# Patient Record
Sex: Female | Born: 1945 | Race: White | Hispanic: No | Marital: Married | Smoking: Never smoker
Health system: Southern US, Community
[De-identification: ages and names within clinical notes are randomized; demographics above are authoritative.]

## PROBLEM LIST (undated history)

## (undated) DIAGNOSIS — M316 Other giant cell arteritis: Secondary | ICD-10-CM

## (undated) DIAGNOSIS — D351 Benign neoplasm of parathyroid gland: Secondary | ICD-10-CM

## (undated) DIAGNOSIS — R519 Headache, unspecified: Secondary | ICD-10-CM

## (undated) DIAGNOSIS — D649 Anemia, unspecified: Secondary | ICD-10-CM

## (undated) DIAGNOSIS — I679 Cerebrovascular disease, unspecified: Secondary | ICD-10-CM

## (undated) DIAGNOSIS — N611 Abscess of the breast and nipple: Secondary | ICD-10-CM

## (undated) DIAGNOSIS — E079 Disorder of thyroid, unspecified: Secondary | ICD-10-CM

## (undated) DIAGNOSIS — E785 Hyperlipidemia, unspecified: Secondary | ICD-10-CM

## (undated) DIAGNOSIS — N904 Leukoplakia of vulva: Secondary | ICD-10-CM

## (undated) DIAGNOSIS — I6529 Occlusion and stenosis of unspecified carotid artery: Secondary | ICD-10-CM

## (undated) DIAGNOSIS — M810 Age-related osteoporosis without current pathological fracture: Secondary | ICD-10-CM

## (undated) DIAGNOSIS — K317 Polyp of stomach and duodenum: Secondary | ICD-10-CM

## (undated) HISTORY — DX: Hyperlipidemia, unspecified: E78.5

## (undated) HISTORY — DX: Anemia, unspecified: D64.9

## (undated) HISTORY — DX: Other giant cell arteritis: M31.6

## (undated) HISTORY — DX: Age-related osteoporosis without current pathological fracture: M81.0

## (undated) HISTORY — DX: Benign neoplasm of parathyroid gland: D35.1

## (undated) HISTORY — DX: Occlusion and stenosis of unspecified carotid artery: I65.29

## (undated) HISTORY — DX: Abscess of the breast and nipple: N61.1

## (undated) HISTORY — PX: POLYPECTOMY: SHX149

## (undated) HISTORY — DX: Disorder of thyroid, unspecified: E07.9

## (undated) HISTORY — DX: Leukoplakia of vulva: N90.4

## (undated) HISTORY — DX: Cerebrovascular disease, unspecified: I67.9

## (undated) HISTORY — PX: COLONOSCOPY: SHX174

## (undated) HISTORY — DX: Polyp of stomach and duodenum: K31.7

---

## 1981-11-12 HISTORY — PX: TUBAL LIGATION: SHX77

## 1992-11-12 HISTORY — PX: KNEE SURGERY: SHX244

## 1998-05-18 ENCOUNTER — Other Ambulatory Visit: Admission: RE | Admit: 1998-05-18 | Discharge: 1998-05-18 | Payer: Self-pay | Admitting: Obstetrics and Gynecology

## 1999-05-17 ENCOUNTER — Other Ambulatory Visit: Admission: RE | Admit: 1999-05-17 | Discharge: 1999-05-17 | Payer: Self-pay | Admitting: Obstetrics and Gynecology

## 2000-04-03 ENCOUNTER — Encounter: Admission: RE | Admit: 2000-04-03 | Discharge: 2000-04-03 | Payer: Self-pay | Admitting: Obstetrics and Gynecology

## 2000-04-03 ENCOUNTER — Encounter: Payer: Self-pay | Admitting: Obstetrics and Gynecology

## 2000-06-19 ENCOUNTER — Other Ambulatory Visit: Admission: RE | Admit: 2000-06-19 | Discharge: 2000-06-19 | Payer: Self-pay | Admitting: Obstetrics and Gynecology

## 2001-04-08 ENCOUNTER — Encounter: Admission: RE | Admit: 2001-04-08 | Discharge: 2001-04-08 | Payer: Self-pay | Admitting: Obstetrics and Gynecology

## 2001-04-08 ENCOUNTER — Encounter: Payer: Self-pay | Admitting: Obstetrics and Gynecology

## 2001-07-01 ENCOUNTER — Other Ambulatory Visit: Admission: RE | Admit: 2001-07-01 | Discharge: 2001-07-01 | Payer: Self-pay | Admitting: Obstetrics and Gynecology

## 2002-01-08 ENCOUNTER — Encounter: Payer: Self-pay | Admitting: Emergency Medicine

## 2002-01-08 ENCOUNTER — Inpatient Hospital Stay (HOSPITAL_COMMUNITY): Admission: EM | Admit: 2002-01-08 | Discharge: 2002-01-09 | Payer: Self-pay | Admitting: Emergency Medicine

## 2002-01-08 ENCOUNTER — Encounter (INDEPENDENT_AMBULATORY_CARE_PROVIDER_SITE_OTHER): Payer: Self-pay | Admitting: Specialist

## 2002-01-08 HISTORY — PX: APPENDECTOMY: SHX54

## 2002-05-06 ENCOUNTER — Encounter: Payer: Self-pay | Admitting: Obstetrics and Gynecology

## 2002-05-06 ENCOUNTER — Encounter: Admission: RE | Admit: 2002-05-06 | Discharge: 2002-05-06 | Payer: Self-pay | Admitting: Obstetrics and Gynecology

## 2002-08-06 ENCOUNTER — Encounter: Admission: RE | Admit: 2002-08-06 | Discharge: 2002-08-06 | Payer: Self-pay | Admitting: Obstetrics and Gynecology

## 2002-08-06 ENCOUNTER — Encounter: Payer: Self-pay | Admitting: Obstetrics and Gynecology

## 2003-05-10 ENCOUNTER — Encounter: Payer: Self-pay | Admitting: Obstetrics and Gynecology

## 2003-05-10 ENCOUNTER — Encounter: Admission: RE | Admit: 2003-05-10 | Discharge: 2003-05-10 | Payer: Self-pay | Admitting: Obstetrics and Gynecology

## 2003-07-26 ENCOUNTER — Other Ambulatory Visit: Admission: RE | Admit: 2003-07-26 | Discharge: 2003-07-26 | Payer: Self-pay | Admitting: Obstetrics and Gynecology

## 2003-11-13 DIAGNOSIS — N611 Abscess of the breast and nipple: Secondary | ICD-10-CM

## 2003-11-13 HISTORY — DX: Abscess of the breast and nipple: N61.1

## 2004-03-01 ENCOUNTER — Encounter: Admission: RE | Admit: 2004-03-01 | Discharge: 2004-03-01 | Payer: Self-pay | Admitting: Obstetrics and Gynecology

## 2004-03-09 ENCOUNTER — Encounter: Admission: RE | Admit: 2004-03-09 | Discharge: 2004-03-09 | Payer: Self-pay | Admitting: Obstetrics and Gynecology

## 2004-05-10 ENCOUNTER — Encounter: Admission: RE | Admit: 2004-05-10 | Discharge: 2004-05-10 | Payer: Self-pay | Admitting: Obstetrics and Gynecology

## 2004-05-23 ENCOUNTER — Encounter: Admission: RE | Admit: 2004-05-23 | Discharge: 2004-05-23 | Payer: Self-pay | Admitting: Obstetrics and Gynecology

## 2004-08-08 ENCOUNTER — Other Ambulatory Visit: Admission: RE | Admit: 2004-08-08 | Discharge: 2004-08-08 | Payer: Self-pay | Admitting: Obstetrics and Gynecology

## 2005-05-11 ENCOUNTER — Encounter: Admission: RE | Admit: 2005-05-11 | Discharge: 2005-05-11 | Payer: Self-pay | Admitting: Internal Medicine

## 2005-08-27 ENCOUNTER — Other Ambulatory Visit: Admission: RE | Admit: 2005-08-27 | Discharge: 2005-08-27 | Payer: Self-pay | Admitting: Obstetrics and Gynecology

## 2006-05-13 ENCOUNTER — Encounter: Admission: RE | Admit: 2006-05-13 | Discharge: 2006-05-13 | Payer: Self-pay | Admitting: Obstetrics and Gynecology

## 2007-04-09 ENCOUNTER — Encounter: Admission: RE | Admit: 2007-04-09 | Discharge: 2007-04-09 | Payer: Self-pay | Admitting: Surgery

## 2007-05-15 ENCOUNTER — Encounter: Admission: RE | Admit: 2007-05-15 | Discharge: 2007-05-15 | Payer: Self-pay | Admitting: Internal Medicine

## 2007-06-13 HISTORY — PX: PARATHYROIDECTOMY: SHX19

## 2007-06-23 ENCOUNTER — Ambulatory Visit (HOSPITAL_COMMUNITY): Admission: RE | Admit: 2007-06-23 | Discharge: 2007-06-24 | Payer: Self-pay | Admitting: Surgery

## 2007-06-23 ENCOUNTER — Encounter (INDEPENDENT_AMBULATORY_CARE_PROVIDER_SITE_OTHER): Payer: Self-pay | Admitting: Surgery

## 2008-05-18 ENCOUNTER — Ambulatory Visit: Payer: Self-pay | Admitting: Vascular Surgery

## 2008-05-28 ENCOUNTER — Encounter: Admission: RE | Admit: 2008-05-28 | Discharge: 2008-05-28 | Payer: Self-pay | Admitting: Internal Medicine

## 2009-05-24 ENCOUNTER — Ambulatory Visit: Payer: Self-pay | Admitting: Vascular Surgery

## 2009-05-30 ENCOUNTER — Encounter: Admission: RE | Admit: 2009-05-30 | Discharge: 2009-05-30 | Payer: Self-pay | Admitting: Internal Medicine

## 2009-11-12 DIAGNOSIS — K317 Polyp of stomach and duodenum: Secondary | ICD-10-CM

## 2009-11-12 HISTORY — DX: Polyp of stomach and duodenum: K31.7

## 2009-12-29 ENCOUNTER — Ambulatory Visit: Payer: Self-pay | Admitting: Vascular Surgery

## 2010-03-23 ENCOUNTER — Encounter: Admission: RE | Admit: 2010-03-23 | Discharge: 2010-03-23 | Payer: Self-pay | Admitting: Internal Medicine

## 2010-06-01 ENCOUNTER — Encounter: Admission: RE | Admit: 2010-06-01 | Discharge: 2010-06-01 | Payer: Self-pay | Admitting: Internal Medicine

## 2010-07-05 ENCOUNTER — Ambulatory Visit: Payer: Self-pay | Admitting: Vascular Surgery

## 2010-07-26 ENCOUNTER — Observation Stay (HOSPITAL_COMMUNITY): Admission: EM | Admit: 2010-07-26 | Discharge: 2010-07-27 | Payer: Self-pay | Admitting: Emergency Medicine

## 2010-07-26 ENCOUNTER — Ambulatory Visit: Payer: Self-pay | Admitting: Internal Medicine

## 2010-07-27 ENCOUNTER — Encounter (INDEPENDENT_AMBULATORY_CARE_PROVIDER_SITE_OTHER): Payer: Self-pay | Admitting: Emergency Medicine

## 2010-08-10 ENCOUNTER — Ambulatory Visit (HOSPITAL_COMMUNITY): Admission: RE | Admit: 2010-08-10 | Discharge: 2010-08-10 | Payer: Self-pay | Admitting: Obstetrics and Gynecology

## 2011-01-10 ENCOUNTER — Other Ambulatory Visit (INDEPENDENT_AMBULATORY_CARE_PROVIDER_SITE_OTHER): Payer: BC Managed Care – PPO

## 2011-01-10 ENCOUNTER — Ambulatory Visit (INDEPENDENT_AMBULATORY_CARE_PROVIDER_SITE_OTHER): Payer: BC Managed Care – PPO | Admitting: Vascular Surgery

## 2011-01-10 DIAGNOSIS — I6529 Occlusion and stenosis of unspecified carotid artery: Secondary | ICD-10-CM

## 2011-01-16 NOTE — Procedures (Unsigned)
CAROTID DUPLEX EXAM  INDICATION:  Carotid disease.  HISTORY: Diabetes:  No. Cardiac:  No. Hypertension:  No. Smoking:  No. Previous Surgery:  No. CV History:  Currently asymptomatic. Amaurosis Fugax No, Paresthesias No, Hemiparesis No.                                      RIGHT             LEFT Brachial systolic pressure:         112               108 Brachial Doppler waveforms:         Normal            Normal Vertebral direction of flow:        Antegrade         Antegrade DUPLEX VELOCITIES (cm/sec) CCA peak systolic                   92                82 ECA peak systolic                   66                66 ICA peak systolic                   106               P = 55/M = 212 ICA end diastolic                   35                P = 25/M = 92 PLAQUE MORPHOLOGY: PLAQUE AMOUNT:                        None              None PLAQUE LOCATION:  IMPRESSION: 1. Increased Doppler velocities of the left mid internal carotid     artery suggest a 60% to 79% stenosis; however, no plaque was     adequately visualized throughout the carotid system. 2. No hemodynamically significant stenosis of the right internal     carotid artery. 3. No significant change in Doppler velocities noted when compared to     the previous examination on 07/05/2010.  ___________________________________________ Di Kindle. Edilia Bo, M.D.  CH/MEDQ  D:  01/11/2011  T:  01/11/2011  Job:  [id number]

## 2011-01-25 LAB — COMPREHENSIVE METABOLIC PANEL
ALT: 12 U/L (ref 0–35)
AST: 19 U/L (ref 0–37)
Albumin: 4.1 g/dL (ref 3.5–5.2)
Alkaline Phosphatase: 58 U/L (ref 39–117)
BUN: 16 mg/dL (ref 6–23)
CO2: 29 mEq/L (ref 19–32)
Calcium: 8.9 mg/dL (ref 8.4–10.5)
Chloride: 102 mEq/L (ref 96–112)
Creatinine, Ser: 0.55 mg/dL (ref 0.4–1.2)
GFR calc Af Amer: >60 mL/min (ref >60)
GFR calc non Af Amer: >60 mL/min (ref >60)
Glucose, Bld: 79 mg/dL (ref 70–99)
Potassium: 3.6 mEq/L (ref 3.5–5.1)
Sodium: 136 mEq/L (ref 135–145)
Total Bilirubin: 0.4 mg/dL (ref 0.3–1.2)
Total Protein: 6.7 g/dL (ref 6.0–8.3)

## 2011-01-25 LAB — CBC
HCT: 34.6 % — ABNORMAL LOW (ref 36.0–46.0)
HCT: 35.1 % — ABNORMAL LOW (ref 36.0–46.0)
Hemoglobin: 11.8 g/dL — ABNORMAL LOW (ref 12.0–15.0)
Hemoglobin: 11.4 g/dL — ABNORMAL LOW (ref 12.0–15.0)
MCH: 31.2 pg (ref 26.0–34.0)
MCH: 29.3 pg (ref 26.0–34.0)
MCHC: 34 g/dL (ref 30.0–36.0)
MCHC: 32.5 g/dL (ref 30.0–36.0)
MCV: 91.8 fL (ref 78.0–100.0)
MCV: 90.2 fL (ref 78.0–100.0)
Platelets: 174 10*3/uL (ref 150–400)
Platelets: 171 10*3/uL (ref 150–400)
RBC: 3.77 MIL/uL — ABNORMAL LOW (ref 3.87–5.11)
RBC: 3.89 MIL/uL (ref 3.87–5.11)
RDW: 13.5 % (ref 11.5–15.5)
RDW: 13.1 % (ref 11.5–15.5)
WBC: 5.4 10*3/uL (ref 4.0–10.5)
WBC: 7.1 10*3/uL (ref 4.0–10.5)

## 2011-01-25 LAB — DIFFERENTIAL
Basophils Absolute: 0 10*3/uL (ref 0.0–0.1)
Basophils Relative: 0 % (ref 0–1)
Eosinophils Absolute: 0 10*3/uL (ref 0.0–0.7)
Eosinophils Relative: 0 % (ref 0–5)
Lymphocytes Relative: 18 % (ref 12–46)
Lymphs Abs: 1.3 10*3/uL (ref 0.7–4.0)
Monocytes Absolute: 0.5 10*3/uL (ref 0.1–1.0)
Monocytes Relative: 6 % (ref 3–12)
Neutro Abs: 5.4 10*3/uL (ref 1.7–7.7)
Neutrophils Relative %: 76 % (ref 43–77)

## 2011-01-25 LAB — POCT CARDIAC MARKERS
CKMB, poc: 1 ng/mL (ref 1.0–8.0)
CKMB, poc: 1.5 ng/mL (ref 1.0–8.0)
CKMB, poc: 3.7 ng/mL (ref 1.0–8.0)
Myoglobin, poc: 283 ng/mL (ref 12–200)
Myoglobin, poc: 64.7 ng/mL (ref 12–200)
Myoglobin, poc: 68.4 ng/mL (ref 12–200)
Troponin i, poc: <0.05 ng/mL (ref 0.00–0.09)
Troponin i, poc: <0.05 ng/mL (ref 0.00–0.09)
Troponin i, poc: <0.05 ng/mL (ref 0.00–0.09)

## 2011-01-25 LAB — POCT I-STAT, CHEM 8
BUN: 14 mg/dL (ref 6–23)
Calcium, Ion: 1.19 mmol/L (ref 1.12–1.32)
Chloride: 102 mEq/L (ref 96–112)
Creatinine, Ser: 0.7 mg/dL (ref 0.4–1.2)
Glucose, Bld: 75 mg/dL (ref 70–99)
HCT: 37 % (ref 36.0–46.0)
Hemoglobin: 12.6 g/dL (ref 12.0–15.0)
Potassium: 3.6 mEq/L (ref 3.5–5.1)
Sodium: 142 mEq/L (ref 135–145)
TCO2: 28 mmol/L (ref 0–100)

## 2011-03-27 NOTE — Assessment & Plan Note (Signed)
OFFICE VISIT   MAHROSH, DONNELL  DOB:  [birth year]                                       05/24/2009  JWJXB#:[id number]   I saw the patient in the office today for followup of her carotid  disease.  I had last seen her in January 2008 when she was asymptomatic.  At that time she had no carotid stenosis on the right and a distal left  carotid stenosis in the 40-59% range.  We have been following this  moderate distal left carotid stenosis, and her most recent duplex was on  05/18/2008, when she had had some slightly increased velocities in her  left carotid stenosis but it was still in the 40-59% range.  She came in  today for a routine followup duplex, and the stenosis on the left had  progressed slightly and now was in the 60-79% range on the left with no  significant stenosis on the right.  Of note, she is left-handed.  She  denies any history of stroke, TIAs, expressive or receptive aphasia, or  amaurosis fugax.   PAST MEDICAL HISTORY:  Fairly unremarkable.  She denies any history of  diabetes, hypertension previous myocardial infarction or congestive  heart failure.  She does have slightly elevated cholesterol.   MEDICATIONS:  Include Fosamax, simvastatin, Caltrate and vitamin D.   REVIEW OF SYSTEMS:  She has had no recent chest pain, chest pressure,  palpitations or arrhythmias.  She has had no productive cough,  bronchitis, asthma or wheezing.   PHYSICAL EXAMINATION:  This is a pleasant 65 year old who appears her  stated age.  Her blood pressure is 128/81, heart rate is 63.  There is  soft left carotid bruit.  Lungs are clear bilaterally to auscultation.  On cardiac exam, she has a regular rate and rhythm.  Neurologic exam is  nonfocal.   Carotid duplex scan shows no significant carotid stenosis on the right.  On the left side she has a 60-79% stenosis.  End-diastolic velocity on  the left is 73 cm/sec.  She has patent vertebral arteries with  normally-  directed flow.   I have explained we would not consider left carotid endarterectomy  unless the stenosis progressed to greater than 80% or she developed new  neurologic symptoms.  I plan on seeing her back in 6 months with a  followup carotid duplex scan.  She knows to call sooner if she has  problems.  In the meantime she knows to continue taking her aspirin.  Of  note, we will need to determine if the stenosis does progress to greater  than 80% if she might need a preoperative cerebral arteriogram if the  lesion appears to be quite high based on her duplex.   Di Kindle. Edilia Bo, M.D.  Electronically Signed   CSD/MEDQ  D:  05/24/2009  T:  05/25/2009  Job:  [id number]   cc:   Barry Dienes. Eloise Harman, M.D.

## 2011-03-27 NOTE — Procedures (Signed)
CAROTID DUPLEX EXAM   INDICATION:  Follow-up evaluation of known carotid artery disease.   HISTORY:  Diabetes:  No.  Cardiac:  No.  Hypertension:  No.  Smoking:  No.  Previous Surgery:  No.  CV History:  No.  Amaurosis Fugax No, Paresthesias No, Hemiparesis No.                                       RIGHT             LEFT  Brachial systolic pressure:         116               122  Brachial Doppler waveforms:         Biphasic          Biphasic  Vertebral direction of flow:        Antegrade         Antegrade  DUPLEX VELOCITIES (cm/sec)  CCA peak systolic                   104               104  ECA peak systolic                   89                98  ICA peak systolic                   94                212 (distal)  ICA end diastolic                   41                73  PLAQUE MORPHOLOGY:                  Homogenous        Homogenous  PLAQUE AMOUNT:                      Mild              Moderate  PLAQUE LOCATION:                    BIF, ICA          ICA distal   IMPRESSION:  1. 20-39% right internal carotid artery stenosis.  2. Left distal internal carotid artery velocities suggest 60-79%      stenosis.        ___________________________________________  Di Kindle. Edilia Bo, M.D.   AC/MEDQ  D:  05/24/2009  T:  05/24/2009  Job:  [id number]

## 2011-03-27 NOTE — Op Note (Signed)
Tiffany Nguyen, Tiffany Nguyen NO.:  [id number]   MEDICAL RECORD NO.:  [id number]          PATIENT TYPE:  AMB   LOCATION:  DAY                          FACILITY:  [facility]   PHYSICIAN:  Velora Heckler, MD      DATE OF BIRTH:  1946-04-16   DATE OF PROCEDURE:  06/23/2007  DATE OF DISCHARGE:                               OPERATIVE REPORT   PREOPERATIVE DIAGNOSIS:  Primary hyperparathyroidism.   POSTOPERATIVE DIAGNOSIS:  Primary hyperparathyroidism.   PROCEDURE:  Right inferior minimally-invasive parathyroidectomy.   SURGEON:  Velora Heckler, MD, FACS   ASSISTANT:  Cyndia Bent, MD, FACS   ANESTHESIA:  General per Jenelle Mages. Fortune, MD   ESTIMATED BLOOD LOSS:  Minimal.   PREPARATION:  Betadine.   COMPLICATIONS:  None.   INDICATIONS:  The patient is a 65 year old white female from [place],  [place].  She was noted to have an elevated serum calcium level.  Laboratory studies included an elevated PTH level.  Bone density scan  showed osteoporosis.  The patient notes fatigue.  She is referred by Dr.  Jarome Matin for primary hyperparathyroidism.  Sestamibi scan was  performed, which demonstrated uptake in the right inferior position.  The patient now comes to surgery for resection.   BODY OF REPORT:  The procedure was done in OR #1 at the [facility].  The patient is brought to the operating room,  placed in a supine position on the operating room table.  Following  administration of general anesthesia the patient is positioned and then  prepped and draped in the usual strict aseptic fashion.  After  ascertaining that an adequate level of anesthesia had been obtained, a  right inferior neck incision is made with a #15 blade.  Dissection is  carried through subcutaneous tissues and platysma.  Hemostasis is  obtained with the electrocautery.  An external jugular vein is ligated  in continuity and divided.  Strap muscles are incised in  the midline and  reflected laterally.  Inferior pole of the right lobe of the thyroid is  exposed.  Dissection in the tracheoesophageal groove on the right  demonstrates an abnormally enlarged parathyroid gland.  This is gently  mobilized with blunt dissection.  The vascular tributaries are divided  with the electrocautery.  The gland is mobilized up onto its vascular  pedicle.  The gland is markedly elongated and enlarged.  Vascular  pedicle is dissected out and divided between medium ligaclips.  It is  excised in its entirety.  It measures 3.5 cm in greatest dimension.  The  gland is submitted to pathology, where Dr. Jimmy Picket performed frozen  section biopsy confirming parathyroid tissue consistent with adenoma.  Good hemostasis is obtained in the neck.  A small piece of Surgicel is  placed in the surgical field.  Strap muscles are reapproximated in the  midline with interrupted 3-0 Vicryl sutures.  Platysma is closed with  interrupted 3-0 Vicryl sutures.  Skin is anesthetized with local  Marcaine.  Skin edges are  reapproximated running with a 4-0 Monocryl subcuticular suture.  Wound  is washed and dried and Benzoin and Steri-Strips are applied.  Sterile  dressings are applied.  The patient is awakened from anesthesia and  brought to the recovery room in stable condition.  The patient tolerated  the procedure well.      Velora Heckler, MD  Electronically Signed     TMG/MEDQ  D:  06/23/2007  T:  06/24/2007  Job:  [id number]   cc:   Barry Dienes. Eloise Harman, M.D.  Fax: [phone]

## 2011-03-27 NOTE — Assessment & Plan Note (Signed)
OFFICE VISIT   Tiffany Nguyen, Tiffany Nguyen  DOB:  12-Mar-1946                                       07/05/2010  NFAOZ#:[id number]   I saw the patient in the office today for continued followup of her  right carotid disease.  We have been following a 60%-79% left carotid  stenosis with no significant stenosis on the right.  She comes in for a  routine followup visit.  Since I saw her last in July of 2010 she denies  any history of stroke, TIAs, expressive or receptive aphasia or  amaurosis fugax.  There has been no significant change in her medical  history.  She denies any history of diabetes, hypertension,  hypercholesterolemia or history of coronary artery disease.   On social history she is married.  She has one child.  She does not use  tobacco.   REVIEW OF SYSTEMS:  CARDIOVASCULAR:  She has had no chest pain, chest  pressure, palpitations or arrhythmias.  She has had no claudication,  rest pain or nonhealing ulcers.  She has had no history of DVT or  phlebitis.  PULMONARY:  She has had no productive cough, bronchitis, asthma or  wheezing.   PHYSICAL EXAMINATION:  General:  This is a pleasant 65 year old woman  who appears her stated age.  Vital signs:  Her blood pressure is 105/68,  heart rate is 65, temperature is 97.4.  HEENT:  Unremarkable.  Lungs:  Are clear bilaterally to auscultation.  Cardiovascular:  I do not detect  any carotid bruits.  She has a regular rate and rhythm.  Abdomen:  Soft  and nontender.  Neurological:  She has no focal weakness or  paresthesias.   I did independently interpret her carotid duplex scan today which shows  no significant change in the velocities on the left.  She has a 60%-79%  left carotid stenosis with no significant stenosis on the right.  Both  vertebral arteries are patent with normally directed flow.   I again explained that we would not consider left carotid endarterectomy  unless the stenosis progressed to  greater than 80% or if she developed  new left hemispheric symptoms.  Of note, the stenosis is fairly high.  If this does progress we might have to consider cerebral arteriography  preoperatively.  The lab will do her followup duplex scan in 6 months  and I will plan on seeing her back only if she develops new symptoms or  her stenosis progresses.     Di Kindle. Edilia Bo, M.D.  Electronically Signed   CSD/MEDQ  D:  07/05/2010  T:  07/06/2010  Job:  [id number]   cc:   Barry Dienes. Eloise Harman, M.D.

## 2011-03-27 NOTE — Procedures (Signed)
CAROTID DUPLEX EXAM   INDICATION:  Followup carotid artery disease.   HISTORY:  Diabetes:  No.  Cardiac:  No.  Hypertension:  No.  Smoking:  No.  Previous Surgery:  No.  CV History:  Asymptomatic.  Amaurosis Fugax No, Paresthesias No, Hemiparesis No                                       RIGHT             LEFT  Brachial systolic pressure:         106               108  Brachial Doppler waveforms:         WNL               WNL  Vertebral direction of flow:        Antegrade         Antegrade  DUPLEX VELOCITIES (cm/sec)  CCA peak systolic                   97                96  ECA peak systolic                   79                79  ICA peak systolic                   100               M/D=246  ICA end diastolic                   45                M/D=108  PLAQUE MORPHOLOGY:                                    None visualized  PLAQUE AMOUNT:                      None              None visualized  PLAQUE LOCATION:                                      None visualized   IMPRESSION:  1. Right internal carotid artery appears within normal limits.  2. Left internal carotid artery distal velocities appear higher than      previous, however, still in the 60%-79% stenosis range.  3. Left internal carotid artery area of elevated velocities extends      from approximately 2.65 cm from the bifurcation to approximately 4      cm from the bifurcation, distal to which velocities drop back to      within normal limits.   ___________________________________________  Di Kindle. Edilia Bo, M.D.   AS/MEDQ  D:  12/29/2009  T:  12/29/2009  Job:  [id number]

## 2011-03-27 NOTE — Procedures (Signed)
CAROTID DUPLEX EXAM   INDICATION:  Carotid disease.   HISTORY:  Diabetes:  No  Cardiac:  No.  Hypertension:  No.  Smoking:  No.  Previous Surgery:  No.  CV History:  Currently asymptomatic.  Amaurosis Fugax No, Paresthesias No, Hemiparesis No.                                       RIGHT             LEFT  Brachial systolic pressure:         110               116  Brachial Doppler waveforms:         Normal            Normal  Vertebral direction of flow:        Antegrade         Antegrade  DUPLEX VELOCITIES (cm/sec)  CCA peak systolic                   105               83  ECA peak systolic                   88                81  ICA peak systolic                   73                229 (mid)  ICA end diastolic                   30                99  PLAQUE MORPHOLOGY:  PLAQUE AMOUNT:                      None              None  PLAQUE LOCATION:   IMPRESSION:  1. No evidence of stenosis noted in the right internal carotid artery.  2. Increased Doppler velocities of the left mid internal carotid      artery suggest a 60% to 79% stenosis; however, no plaque formation      was adequately visualized in this region.  3. No significant change noted when compared to the previous      examination on 12/29/2009.   ___________________________________________  Di Kindle. Edilia Bo, M.D.   CH/MEDQ  D:  07/05/2010  T:  07/05/2010  Job:  [id number]

## 2011-03-27 NOTE — Procedures (Signed)
CAROTID DUPLEX EXAM   INDICATION:  Followup of known increased velocity in left distal ICA.   HISTORY:  Diabetes:  No.  Cardiac:  No.  Hypertension:  No.  Smoking:  No.  Previous Surgery:  No.  CV History:  Amaurosis Fugax No, Paresthesias No, Hemiparesis No.                                       RIGHT             LEFT  Brachial systolic pressure:         113               115  Brachial Doppler waveforms:         WNL               WNL  Vertebral direction of flow:        Antegrade         Antegrade  DUPLEX VELOCITIES (cm/sec)  CCA peak systolic                   103               81  ECA peak systolic                   76                59  ICA peak systolic                   83                185 (Distal)  ICA end diastolic                   31                72 (Distal)  PLAQUE MORPHOLOGY:                  None              None  PLAQUE AMOUNT:                      None              None  PLAQUE LOCATION:                    None              Not visualized   IMPRESSION:  1. No proximal internal carotid artery stenosis bilaterally.  2. Increased velocities noted in left distal internal carotid artery,      which has increased since previous study; however, is categorically      unchanged.      ___________________________________________  Di Kindle. Edilia Bo, M.D.   PB/MEDQ  D:  05/18/2008  T:  05/18/2008  Job:  [id number]

## 2011-03-30 NOTE — H&P (Signed)
. [facility]  Patient:    Tiffany Nguyen, Tiffany Nguyen Visit Number: [id number] MRN: [id number]          Service Type: MED Location: 1800 [id number] [id number] Attending Physician:  Devoria Albe Dictated by:   Angelia Mould. Derrell Lolling, M.D. Admit Date:  01/08/2002   CC:         Dr. Tressia Miners. Ambrose Mantle, M.D.   History and Physical  CHIEF COMPLAINT:  Abdominal pain and vomiting.  HISTORY OF PRESENT ILLNESS:  This is a 65 year old white female in excellent health.  Yesterday at about 2 p.m. she noticed somewhat insidious onset of mid and lower abdominal pain, a little bit crampy. This persisted.  She has not had any diarrhea. She has had two normal bowel movements today. She has become nauseated and has vomited once today. The pain is now localized to the right lower quadrant.  She had a CT scan at the direction of the emergency department physician which shows acute appendicitis but no evidence of abscess or rupture.  PAST MEDICAL HISTORY:  She has had left knee arthroscopy.  She has had a laparoscopic tubal.  CURRENT MEDICATIONS:  Caltrate.  She recently took estrogens but that has been discontinued.  DRUG ALLERGIES:  None named.  SOCIAL HISTORY:  She is married.  She has one daughter. 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 She denies any use of alcohol or tobacco.  FAMILY HISTORY:  Mother died at age 5.  She fell then had a pulmonary embolus after orthopedic surgery.  Father died at age 73, had COPD, arthritis, and died of "natural causes."  She has two brothers, one had appendicitis recently.  REVIEW OF SYSTEMS:  All systems were reviewed and are noncontributory except as described above.  PHYSICAL EXAMINATION:  GENERAL APPEARANCE:  A thin, health, pleasant white female in mild distress.  VITAL SIGNS:  Temperature 97.9, heart rate 74, blood pressure 104/56, respiratory rate 20.  HEENT:  Sclerae are clear. Extraocular  movements intact.  Oropharynx clear.  NECK:  Supple, nontender, no mass, no bruit.  No thyromegaly.  LUNGS:  Clear to auscultation, no CVA tenderness.  CARDIOVASCULAR:  Regular rate and rhythm with no murmur.  BREASTS:  Not examined.  ABDOMEN:  Not distended, soft, hypoactive bowel sounds.  She does have localized tenderness and localized guarding in the right lower quadrant. I do not feel a mass. She does not have any referred rebound and minimal direct rebound.  EXTREMITIES:  No edema and good pulses.  NEUROLOGICAL:  Grossly within normal limits.  ADMISSION DATA:  White blood cell count 12,900. Complete metabolic panel normal.  Amylase 80.  Urinalysis showed 7 to 10 wbc.  CT scan is consistent with acute appendicitis.  IMPRESSION:  Acute appendicitis, possible early, without evidence of rupture.  PLAN:  The patient will be taken to the operating room for a diagnostic laparoscopy and appendectomy.  I have explained to the patient and her husband indications and details of this procedure.  Risks and complications have been outlined, including but not limited to bleeding, infection, conversion of laparotomy, injury to adjacent organs, wound problems such as hernia or infection.  She has been advised that this may be some other diagnosis including cancer, colitis, Crohns disease, gynecologic problems and so forth, but appendicitis is by far the most common problem.  She seems to understand all of these issues well and at this time all of her questions  are answered. She agrees with this plan and would like to go ahead with surgery now. Dictated by:   Angelia Mould. Derrell Lolling, M.D. Attending Physician:  Devoria Albe DD:  01/08/02 TD:  01/08/02 Job: [id number]

## 2011-04-27 ENCOUNTER — Other Ambulatory Visit (HOSPITAL_BASED_OUTPATIENT_CLINIC_OR_DEPARTMENT_OTHER): Payer: Self-pay | Admitting: Internal Medicine

## 2011-04-27 DIAGNOSIS — Z1231 Encounter for screening mammogram for malignant neoplasm of breast: Secondary | ICD-10-CM

## 2011-05-18 ENCOUNTER — Other Ambulatory Visit (HOSPITAL_BASED_OUTPATIENT_CLINIC_OR_DEPARTMENT_OTHER): Payer: Self-pay | Admitting: Internal Medicine

## 2011-05-18 DIAGNOSIS — M545 Low back pain, unspecified: Secondary | ICD-10-CM

## 2011-05-22 ENCOUNTER — Ambulatory Visit
Admission: RE | Admit: 2011-05-22 | Discharge: 2011-05-22 | Disposition: A | Discharge status: Home / Self Care | Payer: BC Managed Care – PPO | Source: Ambulatory Visit | Attending: Internal Medicine | Admitting: Internal Medicine

## 2011-05-22 DIAGNOSIS — M545 Low back pain, unspecified: Secondary | ICD-10-CM

## 2011-06-04 ENCOUNTER — Ambulatory Visit
Admission: RE | Admit: 2011-06-04 | Discharge: 2011-06-04 | Disposition: A | Discharge status: Home / Self Care | Payer: BC Managed Care – PPO | Source: Ambulatory Visit | Attending: Internal Medicine | Admitting: Internal Medicine

## 2011-06-04 DIAGNOSIS — Z1231 Encounter for screening mammogram for malignant neoplasm of breast: Secondary | ICD-10-CM

## 2011-06-27 ENCOUNTER — Other Ambulatory Visit: Payer: BC Managed Care – PPO

## 2011-07-04 ENCOUNTER — Other Ambulatory Visit (INDEPENDENT_AMBULATORY_CARE_PROVIDER_SITE_OTHER): Payer: BC Managed Care – PPO

## 2011-07-04 DIAGNOSIS — I6529 Occlusion and stenosis of unspecified carotid artery: Secondary | ICD-10-CM

## 2011-07-12 NOTE — Procedures (Unsigned)
CAROTID DUPLEX EXAM  INDICATION:  Follow up carotid stenosis.  HISTORY: Diabetes:  No. Cardiac:  No. Hypertension:  No. Smoking:  No. Previous Surgery:  No previous carotid intervention. CV History:  Asymptomatic. Amaurosis Fugax No, Paresthesias No, Hemiparesis No.                                      RIGHT             LEFT Brachial systolic pressure:         100               108 Brachial Doppler waveforms:         WNL               WNL Vertebral direction of flow:        Antegrade         Antegrade DUPLEX VELOCITIES (cm/sec) CCA peak systolic                   106               97 ECA peak systolic                   96                84 ICA peak systolic                   86                97 - P/189 - M ICA end diastolic                   31                34 - P/70 - M PLAQUE MORPHOLOGY:                  Heterogenous      Heterogenous PLAQUE AMOUNT:                      Mild              Moderate PLAQUE LOCATION:                    CCA, ICA          ICA, mid  IMPRESSION: 1. Right internal carotid artery stenosis in the 1% to 39% range. 2. Left internal carotid artery stenosis in the 60% to 79% range in     the mid segment; however, no significant plaque could be adequately     identified.  Vessel tortuosity is present throughout the left     internal carotid artery system. 3. Essentially unchanged since the previous study on 01/10/2011.  ___________________________________________ Di Kindle. Edilia Bo, M.D.  SH/MEDQ  D:  07/04/2011  T:  07/04/2011  Job:  [id number]

## 2011-08-27 LAB — BASIC METABOLIC PANEL
BUN: 9
CO2: 29
Calcium: 11.1 — ABNORMAL HIGH
Chloride: 108
Creatinine, Ser: 0.57
GFR calc Af Amer: >60
GFR calc non Af Amer: >60
Glucose, Bld: 98
Potassium: 3.9
Sodium: 139

## 2011-08-27 LAB — DIFFERENTIAL
Basophils Absolute: 0
Basophils Relative: 1
Eosinophils Absolute: 0
Eosinophils Relative: 1
Lymphocytes Relative: 32
Lymphs Abs: 1.7
Monocytes Absolute: 0.3
Monocytes Relative: 6
Neutro Abs: 3.2
Neutrophils Relative %: 61

## 2011-08-27 LAB — URINALYSIS, ROUTINE W REFLEX MICROSCOPIC
Bilirubin Urine: NEGATIVE
Glucose, UA: NEGATIVE
Hgb urine dipstick: NEGATIVE
Ketones, ur: NEGATIVE
Nitrite: NEGATIVE
Protein, ur: NEGATIVE
Specific Gravity, Urine: 1.007
Urobilinogen, UA: 0.2
pH: 6

## 2011-08-27 LAB — CBC
HCT: 32.8 — ABNORMAL LOW
Hemoglobin: 11.3 — ABNORMAL LOW
MCHC: 34.3
MCV: 88.3
Platelets: 188
RBC: 3.72 — ABNORMAL LOW
RDW: 13.6
WBC: 5.3

## 2011-08-27 LAB — CALCIUM
Calcium: 8.8
Calcium: 9.7

## 2011-08-27 LAB — PROTIME-INR
INR: 1.1
Prothrombin Time: 14

## 2012-01-24 ENCOUNTER — Other Ambulatory Visit: Payer: Self-pay | Admitting: *Deleted

## 2012-01-24 ENCOUNTER — Encounter: Payer: Self-pay | Admitting: Vascular Surgery

## 2012-01-25 DIAGNOSIS — D649 Anemia, unspecified: Secondary | ICD-10-CM

## 2012-01-25 HISTORY — DX: Anemia, unspecified: D64.9

## 2012-01-29 ENCOUNTER — Encounter: Payer: Self-pay | Admitting: Vascular Surgery

## 2012-01-30 ENCOUNTER — Ambulatory Visit (INDEPENDENT_AMBULATORY_CARE_PROVIDER_SITE_OTHER): Payer: BC Managed Care – PPO | Admitting: Vascular Surgery

## 2012-01-30 ENCOUNTER — Ambulatory Visit (INDEPENDENT_AMBULATORY_CARE_PROVIDER_SITE_OTHER): Payer: BC Managed Care – PPO | Admitting: *Deleted

## 2012-01-30 ENCOUNTER — Encounter: Payer: Self-pay | Admitting: Vascular Surgery

## 2012-01-30 VITALS — BP 110/70 | HR 71 | Resp 16 | Ht 65.0 in | Wt 108.0 lb

## 2012-01-30 DIAGNOSIS — I6529 Occlusion and stenosis of unspecified carotid artery: Secondary | ICD-10-CM

## 2012-01-30 NOTE — Progress Notes (Signed)
[facility] [place]  Patient name: Tiffany Nguyen MRN: [id number] DOB: 1946/03/21 Sex: female  REASON FOR VISIT: Follow up of carotid disease  HPI: Tiffany Nguyen is a 66 y.o. female who I been following with a moderate left carotid stenosis. She been seen at yearly intervals. Since I saw her last, she denies any history of stroke, TIAs, expressive or receptive aphasia, or amaurosis fugax.  There has been no significant change in her medical history. She has a history of hyperlipidemia which is been well controlled on her current medication.  Past Medical History  Diagnosis Date  . Hyperlipidemia   . Anemia 01/25/12    Slight    Family History  Problem Relation Age of Onset  . COPD Father   . Hypertension Mother     SOCIAL HISTORY: History  Substance Use Topics  . Smoking status: Never Smoker   . Smokeless tobacco: Not on file  . Alcohol Use: No    Allergies  Allergen Reactions  . Reprexain Nausea And Vomiting    Current Outpatient Prescriptions  Medication Sig Dispense Refill  . alendronate (FOSAMAX) 40 MG tablet Take 40 mg by mouth every 7 (seven) days. Take with a full glass of water on an empty stomach.      Marland Kitchen aspirin 81 MG tablet Take 81 mg by mouth daily.      . Calcium Carbonate-Vitamin D (CALTRATE 600+D PO) Take by mouth.      . simvastatin (ZOCOR) 40 MG tablet Take 40 mg by mouth every evening.      . Vitamin D, Ergocalciferol, (DRISDOL) 50000 UNITS CAPS Take 50,000 Units by mouth.        REVIEW OF SYSTEMS: Arly.Keller ] denotes positive finding; [  ] denotes negative finding  CARDIOVASCULAR:  [ ]  chest pain   [ ]  chest pressure   [ ]  palpitations   [ ]  orthopnea   [ ]  dyspnea on exertion   [ ]  claudication   [ ]  rest pain   [ ]  DVT   [ ]  phlebitis PULMONARY:   [ ]  productive cough   [ ]  asthma   [ ]  wheezing NEUROLOGIC:   [ ]  weakness  [ ]  paresthesias  [ ]  aphasia  [ ]  amaurosis  [ ]  dizziness HEMATOLOGIC:   [ ]  bleeding problems   [ ]  clotting  disorders MUSCULOSKELETAL:  [ ]  joint pain   [ ]  joint swelling [ ]  leg swelling GASTROINTESTINAL: [ ]   blood in stool  [ ]   hematemesis GENITOURINARY:  [ ]   dysuria  [ ]   hematuria PSYCHIATRIC:  [ ]  history of major depression INTEGUMENTARY:  [ ]  rashes  [ ]  ulcers CONSTITUTIONAL:  [ ]  fever   [ ]  chills  PHYSICAL EXAM: Filed Vitals:   01/30/12 1619 01/30/12 1624  BP: 106/48 110/70  Pulse: 67 71  Resp: 16   Height: 5\' 5"  (1.651 m)   Weight: 108 lb (48.988 kg)   SpO2: 99% 98%   Body mass index is 17.97 kg/(m^2). GENERAL: The patient is a well-nourished female, in no acute distress. The vital signs are documented above. CARDIOVASCULAR: There is a regular rate and rhythm without significant murmur appreciated. I do not detect carotid bruits. She has palpable popliteal and pedal pulses. She has no significant lower extremity swelling. PULMONARY: There is good air exchange bilaterally without wheezing or rales. ABDOMEN: Soft and non-tender with normal pitched bowel sounds.  MUSCULOSKELETAL: There are no major  deformities or cyanosis. NEUROLOGIC: No focal weakness or paresthesias are detected. SKIN: There are no ulcers or rashes noted. PSYCHIATRIC: The patient has a normal affect.  DATA:  I have independently interpreted her carotid duplex scan which shows no significant carotid stenosis on the right. On the left side is a 60-79% stenosis. The velocities had not changed compared this study one year ago.  MEDICAL ISSUES: She understands we would not consider left carotid endarterectomy unless the stenosis progressed to greater than 80% or he developed left hemispheric symptoms. I've ordered a fall carotid duplex scan in 1 year and I'll see her back at that time. She knows to call sooner if she has problems. In the meantime she knows to continue taking her aspirin.  Chipper Koudelka S [facility]  Beeper: [phone]

## 2012-02-06 NOTE — Procedures (Unsigned)
CAROTID DUPLEX EXAM  INDICATION:  Follow up carotid artery stenosis.  HISTORY: Diabetes:  No. Cardiac:  No. Hypertension:  No. Smoking:  No. Previous Surgery: CV History:  Asymptomatic. Amaurosis Fugax No, Paresthesias No, Hemiparesis No.                                      RIGHT             LEFT Brachial systolic pressure:         102               102 Brachial Doppler waveforms:         Triphasic         Triphasic Vertebral direction of flow:        Antegrade         Antegrade DUPLEX VELOCITIES (cm/sec) CCA peak systolic                   115               105 ECA peak systolic                   99                79 ICA peak systolic                   98 (midcurve ICA )                  235 (mid tortuous) ICA end diastolic                   50                85 PLAQUE MORPHOLOGY:                  Minimal           Minimal PLAQUE AMOUNT:                      Soft              Soft PLAQUE LOCATION:                    ICA               ICA  IMPRESSION: 1. 1% to 39% right internal carotid artery stenosis. 2. Velocities in the 60% to 79% range in the left mid internal carotid     artery segment.  Vessel is tortuous, as noted on previous study. 3. No change since prior study of 07/04/11.  ___________________________________________ Di Kindle. Edilia Bo, M.D.  SS/MEDQ  D:  01/30/2012  T:  01/30/2012  Job:  [id number]

## 2012-04-30 ENCOUNTER — Other Ambulatory Visit: Payer: Self-pay | Admitting: Internal Medicine

## 2012-04-30 DIAGNOSIS — Z1231 Encounter for screening mammogram for malignant neoplasm of breast: Secondary | ICD-10-CM

## 2012-06-04 ENCOUNTER — Ambulatory Visit
Admission: RE | Admit: 2012-06-04 | Discharge: 2012-06-04 | Disposition: A | Discharge status: Home / Self Care | Payer: BC Managed Care – PPO | Source: Ambulatory Visit | Attending: Internal Medicine | Admitting: Internal Medicine

## 2012-06-04 DIAGNOSIS — Z1231 Encounter for screening mammogram for malignant neoplasm of breast: Secondary | ICD-10-CM

## 2012-07-15 IMAGING — CR DG CHEST 1V PORT
1 series · 1 of 1 positions shown · non-contrast
Comparison: 06/18/2007

CLINICAL DATA: Chest pain

PORTABLE CHEST - 1 VIEW

[AP]
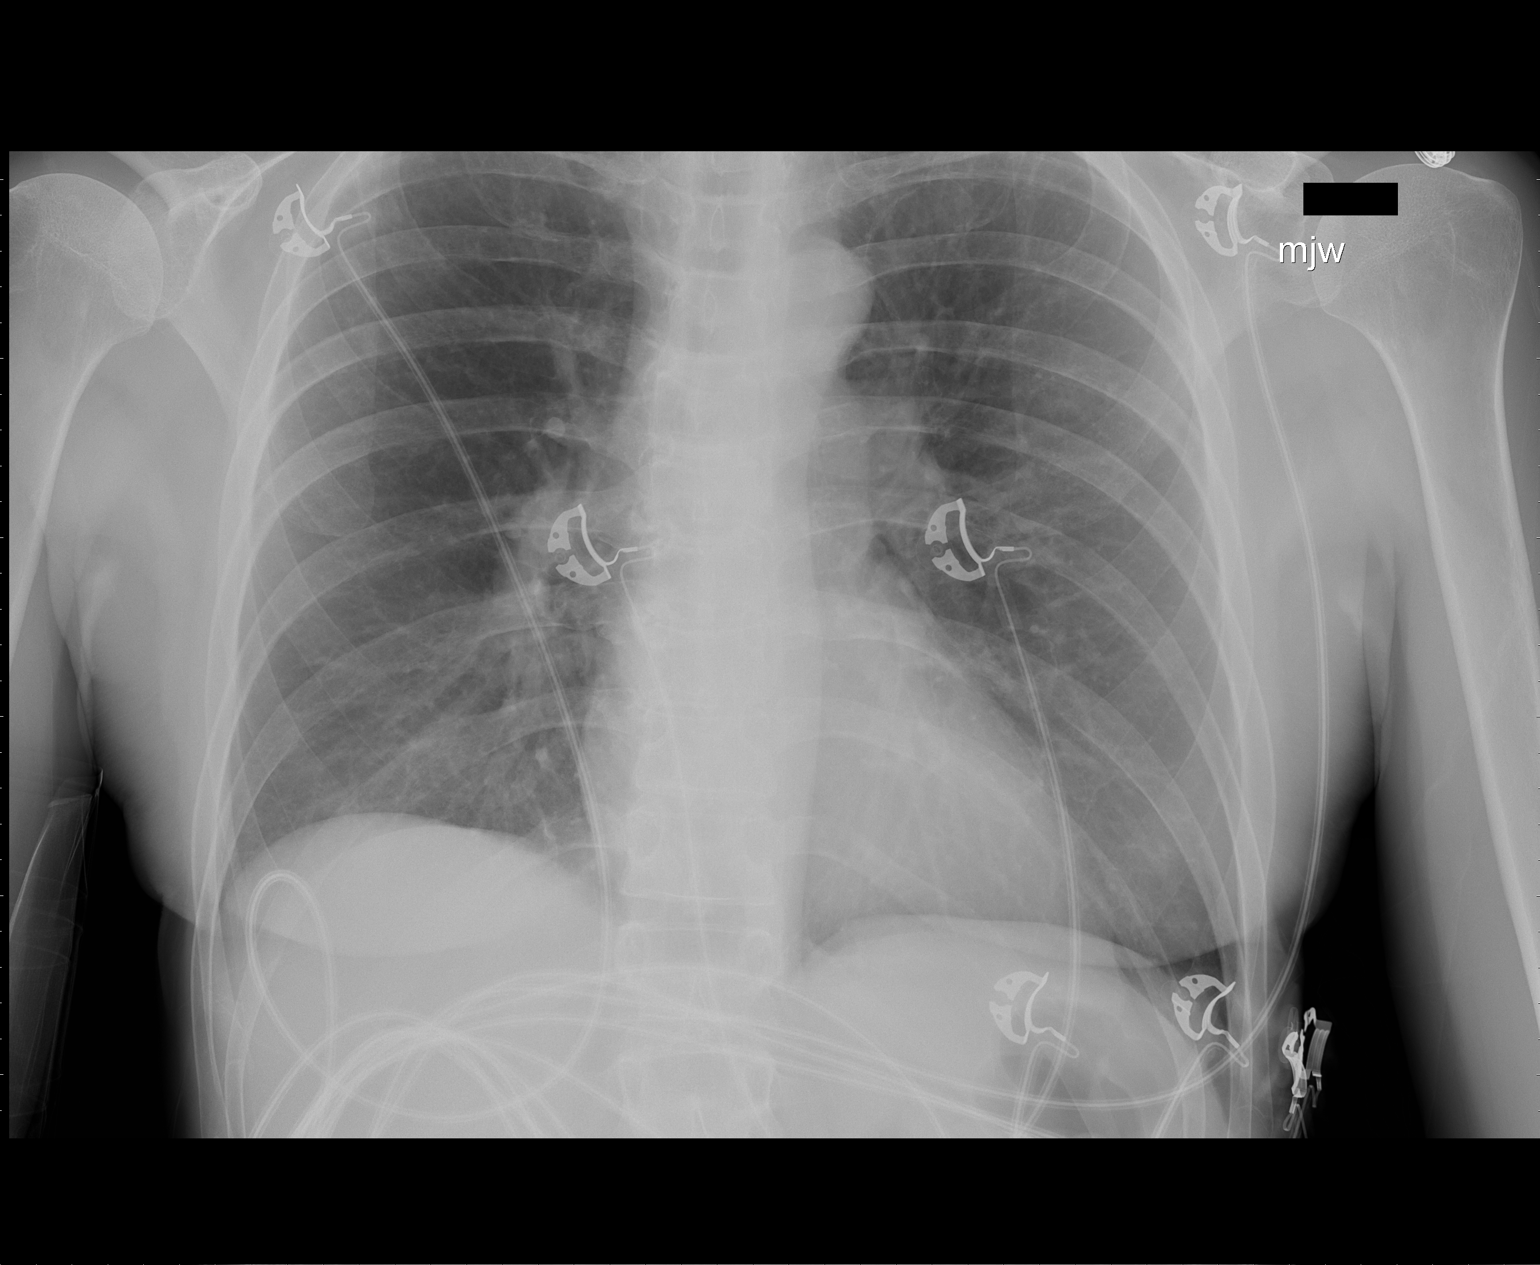

[1 of 1 positions shown; findings below may reference images not displayed]

FINDINGS: Heart and mediastinal contours normal.  Lungs clear.
Osseous structures intact in one-view.
IMPRESSION: No active disease in one-view.

## 2013-02-03 ENCOUNTER — Other Ambulatory Visit: Payer: BC Managed Care – PPO

## 2013-02-03 ENCOUNTER — Ambulatory Visit: Payer: BC Managed Care – PPO | Admitting: Neurosurgery

## 2013-02-06 ENCOUNTER — Other Ambulatory Visit: Payer: BC Managed Care – PPO

## 2013-02-24 ENCOUNTER — Encounter: Payer: Self-pay | Admitting: Vascular Surgery

## 2013-02-25 ENCOUNTER — Ambulatory Visit (INDEPENDENT_AMBULATORY_CARE_PROVIDER_SITE_OTHER): Payer: BC Managed Care – PPO | Admitting: Vascular Surgery

## 2013-02-25 ENCOUNTER — Encounter: Payer: Self-pay | Admitting: Vascular Surgery

## 2013-02-25 ENCOUNTER — Other Ambulatory Visit (INDEPENDENT_AMBULATORY_CARE_PROVIDER_SITE_OTHER): Payer: BC Managed Care – PPO | Admitting: *Deleted

## 2013-02-25 DIAGNOSIS — I6529 Occlusion and stenosis of unspecified carotid artery: Secondary | ICD-10-CM

## 2013-02-25 NOTE — Progress Notes (Unsigned)
[facility] [place] HISTORY AND PHYSICAL   CC:  Follow up carotid duplex scan  Referring Provider:  Jarome Matin, MD  HPI: This is a 67 y.o. female who has known carotid stenosis is here for f/u carotid duplex scan.  Denies amaurosis fugax, paresthesias, or hemiparesis.  She states that she has some numbness in her left hand when she awakes, but this clears within 5 minutes of being awake.  Past Medical History  Diagnosis Date  . Hyperlipidemia   . Anemia 01/25/12    Slight  . Carotid artery occlusion   . 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 Appendectomy    . Tubal ligation    . Knee surgery    . Parathyroidectomy      Allergies  Allergen Reactions  . Hydrocodone-Ibuprofen Nausea And Vomiting  . Ranexa (Ranolazine) Nausea And Vomiting    Current Outpatient Prescriptions  Medication Sig Dispense Refill  . aspirin 81 MG tablet Take 81 mg by mouth daily.      . Calcium Carbonate-Vitamin D (CALTRATE 600+D PO) Take by mouth.      . simvastatin (ZOCOR) 40 MG tablet Take 40 mg by mouth every evening.      . Vitamin D, Ergocalciferol, (DRISDOL) 50000 UNITS CAPS Take 50,000 Units by mouth.      Marland Kitchen alendronate (FOSAMAX) 40 MG tablet Take 40 mg by mouth every 7 (seven) days. Take with a full glass of water on an empty stomach.       No current facility-administered medications for this visit.    Family History  Problem Relation Age of Onset  . COPD Father   . Hypertension Mother     History   Social History  . Marital Status: Married    Spouse Name: N/A    Number of Children: N/A  . Years of Education: N/A   Occupational History  . Not on file.   Social History Main Topics  . Smoking status: Never Smoker   . Smokeless tobacco: Never Used  . Alcohol Use: No  . Drug Use: No  . Sexually Active: Not on file   Other Topics Concern  . Not on file   Social History Narrative  . No  narrative on file     ROS: [x]  Positive   [ ]  Negative   [ x] All sytems reviewed and are negative  General: [ ]  Weight loss, [ ]  Fever, [ ]  chills Neurologic: [ ]  Dizziness, [ ]  Blackouts, [ ]  Seizure [ ]  Stroke, [ ]  "Mini stroke", [ ]  Slurred speech, [ ]  Temporary blindness; [ ]  weakness in arms or legs, [ ]  Hoarseness Cardiac: [ ]  Chest pain/pressure, [ ]  Shortness of breath at rest [ ]  Shortness of breath with exertion, [ ]  Atrial fibrillation or irregular heartbeat Vascular: [ ]  Pain in legs with walking, [ ]  Pain in legs at rest, [ ]  Pain in legs at night,  [ ]  Non-healing ulcer, [ ]  Blood clot in vein/DVT,   Pulmonary: [ ]  Home oxygen, [ ]  Productive cough, [ ]  Coughing up blood, [ ]  Asthma,  [ ]  Wheezing Musculoskeletal:  [ ]  Arthritis, [ ]  Low back pain, [ ]  Joint pain Hematologic: [ ]  Easy Bruising, [ ]  Anemia; [ ]  Hepatitis Gastrointestinal: [ ]  Blood in stool, [ ]  Gastroesophageal Reflux/heartburn, [ ]  Trouble swallowing Urinary: [ ]  chronic Kidney disease, [ ]  on HD - [ ]   MWF or [ ]  TTHS, [ ]  Burning with urination, [ ]  Difficulty urinating Endocrine: [ ]  hx of diabetes, [ ]  hx of thyroid disease Skin: [ ]  Rashes, [ ]  Wounds Psychological: [ ]  Anxiety, [ ]  Depression   PHYSICAL EXAMINATION:  Filed Vitals:   02/25/13 1626  BP: 111/72  Pulse: 61  Resp:    Body mass index is 17.81 kg/(m^2).  General:  WDWN in NAD Gait: Normal HENT: WNL; normocephalic Eyes: PERRL Pulmonary: normal non-labored breathing , without Rales, rhonchi,  wheezing Cardiac: RRR, without  Murmurs, rubs or gallops; without carotid bruits Abdomen: soft, NT, no masses Skin: without rashes,  ulcers  Vascular Exam/Pulses: + palpable bilateral radial pulses;  Extremities: without ischemic changes, without Gangrene , without cellulitis; without open wounds;  Musculoskeletal: without muscle wasting or atrophy  Neurologic: A&O X 3; Appropriate Affect ; SENSATION: normal; MOTOR FUNCTION:  moving all  extremities equally. Speech is fluent/normal   Non-Invasive Vascular Imaging: Carotid Duplex Scan:  02/25/2013  60-79% stenosis on the left and no significant stenosis on the right.  This is essentially unchanged from a year ago.  ASSESSMENT/PLAN: 67 y.o. female here for f/u carotid duplex scan.   -Pt is asymptomatic and her carotid duplex is essentially unchanged from previous exam.  Will have her f/u in one year with carotid duplex.   Doreatha Massed, PA-C [facility] [phone]   Clinic MD:   Pt seen and examined in conjunction with Dr. Edilia Bo  Agree with above. I'll see her back in one year with follow carotid duplex scan. She knows to call sooner if she has problems. In the meantime she knows to continue taking her aspirin.  Waverly Ferrari, MD, FACS Beeper [phone] 02/25/2013

## 2013-02-25 NOTE — Assessment & Plan Note (Signed)
She has a 60-79% left internal carotid artery stenosis in the low in of that range. She has no significant right carotid stenosis. It is safe to continue her follow up in one year intervals. I'll see her back in one year with follow carotid duplex scan. She knows to call sooner if she has problems. In the meantime she knows to continue taking her aspirin.

## 2013-04-27 ENCOUNTER — Other Ambulatory Visit: Payer: Self-pay

## 2013-04-27 DIAGNOSIS — Z1231 Encounter for screening mammogram for malignant neoplasm of breast: Secondary | ICD-10-CM

## 2013-06-05 ENCOUNTER — Ambulatory Visit
Admission: RE | Admit: 2013-06-05 | Discharge: 2013-06-05 | Disposition: A | Discharge status: Home / Self Care | Payer: BC Managed Care – PPO | Source: Ambulatory Visit

## 2013-06-05 DIAGNOSIS — Z1231 Encounter for screening mammogram for malignant neoplasm of breast: Secondary | ICD-10-CM

## 2013-12-18 ENCOUNTER — Other Ambulatory Visit: Payer: Self-pay | Admitting: Vascular Surgery

## 2013-12-18 DIAGNOSIS — I6529 Occlusion and stenosis of unspecified carotid artery: Secondary | ICD-10-CM

## 2014-01-11 ENCOUNTER — Encounter: Payer: Self-pay | Admitting: Obstetrics and Gynecology

## 2014-01-20 ENCOUNTER — Ambulatory Visit (INDEPENDENT_AMBULATORY_CARE_PROVIDER_SITE_OTHER): Payer: No Typology Code available for payment source | Admitting: Nurse Practitioner

## 2014-01-20 ENCOUNTER — Encounter: Payer: Self-pay | Admitting: Nurse Practitioner

## 2014-01-20 VITALS — BP 108/70 | HR 84 | Resp 16 | Ht 64.25 in | Wt 108.0 lb

## 2014-01-20 DIAGNOSIS — E559 Vitamin D deficiency, unspecified: Secondary | ICD-10-CM

## 2014-01-20 DIAGNOSIS — IMO0002 Reserved for concepts with insufficient information to code with codable children: Secondary | ICD-10-CM | POA: Insufficient documentation

## 2014-01-20 DIAGNOSIS — Z01419 Encounter for gynecological examination (general) (routine) without abnormal findings: Secondary | ICD-10-CM | POA: Insufficient documentation

## 2014-01-20 DIAGNOSIS — N952 Postmenopausal atrophic vaginitis: Secondary | ICD-10-CM

## 2014-01-20 DIAGNOSIS — M810 Age-related osteoporosis without current pathological fracture: Secondary | ICD-10-CM

## 2014-01-20 MED ORDER — ESTROGENS, CONJUGATED 0.625 MG/GM VA CREA: 1.0000 | TOPICAL_CREAM | Freq: Every day | VAGINAL | Status: DC

## 2014-01-20 NOTE — Progress Notes (Signed)
Patient ID: Tiffany Nguyen, female   DOB: [birth year], 68 y.o.   MRN: [id number] 68 y.o. G68P1001 Married Caucasian Fe here for annual exam. Postmenopausal since 1998.  initially some vaso symptoms that were tolerable and took HRT for about 5 years then off.  She now is having problems with vaginal dryness.  Very uncomfortable and pain with SA.  Husband also with ED problems and SA is not very frequent.    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  Patient's last menstrual period was 11/12/1996.          Sexually active: yes  The current method of family planning is post menopausal status.    Exercising: yes  walking Smoker:  no  Health Maintenance: Pap:  2013, normal, no history of abnormal MMG:  06/2013, normal Colonoscopy:  2011, Dr. Earlean Shawl, repeat in 10 years BMD:   07/2013 osteoporosis on Fosamax for 5 years TDaP:  2013 Shingles vaccine: 2008 Labs: PCP, Dr. Sharlett Iles   reports that she has never smoked. She has never used smokeless tobacco. She reports that she does not drink alcohol or use illicit drugs.  Past Medical History  Diagnosis Date  . Hyperlipidemia   . Anemia 01/25/12    Slight  . Carotid artery occlusion     right  . 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 Knee surgery Left 1994  . Parathyroidectomy  06/2007  . Tubal ligation  1983  . Appendectomy  01/08/2002    Current Outpatient Prescriptions  Medication Sig Dispense Refill  . aspirin 81 MG tablet Take 81 mg by mouth daily.      . Calcium Carbonate-Vitamin D (CALTRATE 600+D PO) Take by mouth.      . cholecalciferol (VITAMIN D) 1000 UNITS tablet Take 1,000 Units by mouth daily.      . simvastatin (ZOCOR) 40 MG tablet Take 40 mg by mouth every evening.      . conjugated estrogens (PREMARIN) vaginal cream Place 1 Applicatorful vaginally daily. Use 1/2 g vaginally at bedtime three times weekly and pea size amount at the urethra  60 g  3   No current  facility-administered medications for this visit.    Family History  Problem Relation Age of Onset  . COPD Father   . Hypertension Mother   . Pulmonary embolism Mother   . Breast cancer Brother     ROS:  Pertinent items are noted in HPI.  Otherwise, a comprehensive ROS was negative.  Exam:   BP 108/70  Pulse 84  Resp 16  Ht 5' 4.25" (1.632 m)  Wt 108 lb (48.988 kg)  BMI 18.39 kg/m2  LMP 11/12/1996 Height: 5' 4.25" (163.2 cm)  Ht Readings from Last 3 Encounters:  01/20/14 5' 4.25" (1.632 m)  02/25/13 5\' 5"  (1.651 m)  01/30/12 5\' 5"  (1.651 m)    General appearance: alert, cooperative and appears stated age Head: Normocephalic, without obvious abnormality, atraumatic Neck: no adenopathy, supple, symmetrical, trachea midline and thyroid normal to inspection and palpation Lungs: clear to auscultation bilaterally Breasts: normal appearance, no masses or tenderness Heart: regular rate and rhythm Abdomen: soft, non-tender; no masses,  no organomegaly Extremities: extremities normal, atraumatic, no cyanosis or edema Skin: Skin color, texture, turgor normal. No rashes or lesions Lymph nodes: Cervical, supraclavicular, and axillary nodes normal. No abnormal inguinal nodes palpated Neurologic: Grossly normal     Pelvic: External genitalia:  All hypo-estrogen effects with fusion  of labia minora and majora.  She had vitiligo changes as well along the labia borders.               Urethra:  pale appearing urethra with urethral caruncle but no mass              Bartholin's and Skene's: normal                 Vagina: very atrophic appearing vagina with normal color and discharge, no lesions              Cervix: anteverted              Pap taken: yes Bimanual Exam:  Uterus:  normal size, contour, position, consistency, mobility, non-tender              Adnexa: no mass, fullness, tenderness               Rectovaginal: Confirms               Anus:  normal sphincter tone, no  lesions  A:  Well Woman with normal exam  Postmenopausal HRT X 5 years   Atrophic vaginitis with labial fusion  dyspareunia  Osteoporosis off Fosamax - considering other treatment  P:   Pap smear as per guidelines   Mammogram due 8/15  RX of Premarin vaginal cream 1/2 gm and pea size amount to the urethra.  Will plan to recheck in 3 months. patient shown applicator with instructions.  Counseled with risk of DVT, CVA, cancer, etc  Will follow with Vit D and pap  Counseled on breast self exam, mammography screening, adequate intake of calcium and vitamin D, diet and exercise return annually or prn  An After Visit Summary was printed and given to the patient.

## 2014-01-20 NOTE — Patient Instructions (Addendum)

## 2014-01-21 LAB — VITAMIN D 25 HYDROXY (VIT D DEFICIENCY, FRACTURES): Vit D, 25-Hydroxy: 53 ng/mL (ref 30–89)

## 2014-01-21 LAB — IPS PAP SMEAR ONLY

## 2014-01-21 MED ORDER — ESTROGENS, CONJUGATED 0.625 MG/GM VA CREA: TOPICAL_CREAM | VAGINAL | Status: DC

## 2014-01-26 NOTE — Progress Notes (Signed)
Encounter reviewed by Dr. Brook Silva.  

## 2014-01-29 ENCOUNTER — Telehealth: Payer: Self-pay | Admitting: *Deleted

## 2014-01-29 NOTE — Telephone Encounter (Signed)
Message copied by Graylon Good on Fri Jan 29, 2014  3:11 PM ------      Message from: Kem Boroughs R      Created: Thu Jan 21, 2014  5:48 PM       Let patient know all was normal with her pap. ------

## 2014-02-01 NOTE — Telephone Encounter (Signed)
Patient is calling stephanie back °

## 2014-02-01 NOTE — Telephone Encounter (Signed)
Returning a call to Stephanie. °

## 2014-02-02 NOTE — Telephone Encounter (Signed)
I have attempted to contact this patient by phone with the following results: left message to return my call on answering machine (home).  

## 2014-02-03 ENCOUNTER — Telehealth: Payer: Self-pay | Admitting: Nurse Practitioner

## 2014-02-03 NOTE — Telephone Encounter (Signed)
I see this in the history - obvious that was a mistake if she says so - but is there anyone who did have breast cancer?  My thinking is the brother was marked instead on sister or aunt by mistake ???  I will be happy to change this.

## 2014-02-03 NOTE — Telephone Encounter (Signed)
Patient is calling for greta

## 2014-02-03 NOTE — Telephone Encounter (Signed)
Pt notified in result note.  

## 2014-02-03 NOTE — Telephone Encounter (Signed)
Pt calling because after reviewing her records that she requested from her 01/20/2014 visit that there was an error under family history. Pt says her brother does not have breast cancer. Would like that corrected.

## 2014-02-03 NOTE — Telephone Encounter (Signed)
Routing to [employer], FNP for review, can you change family history?

## 2014-02-04 ENCOUNTER — Encounter: Payer: Self-pay | Admitting: Certified Nurse Midwife

## 2014-02-04 NOTE — Telephone Encounter (Signed)
Reviewed not in chart now

## 2014-02-04 NOTE — Telephone Encounter (Signed)
Spoke with patient. Doubled checked to see if any one else in family has a history of breast cancer that we may have accidentally marked wrong. Patient states that no on in her family has a history of breast cancer and was just concerned with this being in her medical records. Advised that the error was fixed in family history in chart. Patient agreeable and verbalizes understanding.  Routing to provider for final review. Patient agreeable to disposition. Will close encounter

## 2014-02-04 NOTE — Telephone Encounter (Signed)
Attempted to call patient. Received a message that the [employer] customer was not available at this time and to try back again later. Will call and follow up again later in the day.

## 2014-02-22 LAB — IFOBT (OCCULT BLOOD): IFOBT: POSITIVE

## 2014-02-24 ENCOUNTER — Ambulatory Visit: Payer: BC Managed Care – PPO | Admitting: Vascular Surgery

## 2014-02-24 ENCOUNTER — Other Ambulatory Visit: Payer: BC Managed Care – PPO

## 2014-03-03 ENCOUNTER — Ambulatory Visit: Payer: BC Managed Care – PPO | Admitting: Vascular Surgery

## 2014-03-03 ENCOUNTER — Other Ambulatory Visit (HOSPITAL_COMMUNITY): Payer: BC Managed Care – PPO

## 2014-03-10 ENCOUNTER — Encounter: Payer: Self-pay | Admitting: Gastroenterology

## 2014-03-15 ENCOUNTER — Other Ambulatory Visit: Payer: Self-pay

## 2014-03-15 DIAGNOSIS — Z1231 Encounter for screening mammogram for malignant neoplasm of breast: Secondary | ICD-10-CM

## 2014-04-29 ENCOUNTER — Encounter: Payer: Self-pay | Admitting: *Deleted

## 2014-04-29 ENCOUNTER — Telehealth: Payer: Self-pay | Admitting: Nurse Practitioner

## 2014-04-29 NOTE — Telephone Encounter (Signed)
Patient canceled  her 3 month appointment 05/03/14. Patient will call later to reschedule.

## 2014-05-03 ENCOUNTER — Ambulatory Visit: Payer: No Typology Code available for payment source | Admitting: Nurse Practitioner

## 2014-05-05 ENCOUNTER — Ambulatory Visit (INDEPENDENT_AMBULATORY_CARE_PROVIDER_SITE_OTHER): Payer: No Typology Code available for payment source | Admitting: Gastroenterology

## 2014-05-05 ENCOUNTER — Encounter: Payer: Self-pay | Admitting: Gastroenterology

## 2014-05-05 VITALS — BP 128/66 | HR 76 | Ht 64.25 in | Wt 110.4 lb

## 2014-05-05 DIAGNOSIS — K921 Melena: Secondary | ICD-10-CM

## 2014-05-05 DIAGNOSIS — R195 Other fecal abnormalities: Secondary | ICD-10-CM

## 2014-05-05 MED ORDER — METOCLOPRAMIDE HCL 5 MG PO TABS: ORAL_TABLET | ORAL | Status: DC

## 2014-05-05 MED ORDER — PEG-KCL-NACL-NASULF-NA ASC-C 100 G PO SOLR: 1.0000 | Freq: Once | ORAL | Status: DC

## 2014-05-05 NOTE — Patient Instructions (Signed)
We have sent the following medications to your pharmacy for you to pick up at your convenience:Reglan 5 mg to take per prep instructions for Colonoscopy.  You have been scheduled for a colonoscopy. Please follow written instructions given to you at your visit today.  Please pick up your prep kit at the pharmacy within the next 1-3 days. If you use inhalers (even only as needed), please bring them with you on the day of your procedure. Your physician has requested that you go to www.startemmi.com and enter the access code given to you at your visit today. This web site gives a general overview about your procedure. However, you should still follow specific instructions given to you by our office regarding your preparation for the procedure.  Thank you for choosing me and [facility].  Pricilla Riffle. Dagoberto Ligas., MD., [facility] Regal

## 2014-05-05 NOTE — Progress Notes (Signed)
    History of Present Illness: This is a 68 year old female accompanied by her daughter. Over the past 3-4 months she has noted occasional episodes of small amounts of bright red blood with bowel movements. iFOBT testing was positive. She has a macrocytic anemia, hemoglobin=11.3 and MCV=107.4 with a low B12. B12 was recently started for she previously underwent colonoscopy and upper endoscopy by Dr. Earlean Shawl in May 2001. Colonoscopy was normal. Upper endoscopy revealed a hyperplastic polyp which was removed. She had peptic duodenitis. Denies weight loss, abdominal pain, constipation, diarrhea, change in stool caliber, melena, nausea, vomiting, dysphagia, reflux symptoms, chest pain.  Review of Systems: Pertinent positive and negative review of systems were noted in the above HPI section. All other review of systems were otherwise negative.  Current Medications, Allergies, Past Medical History, Past Surgical History, Family History and Social History were reviewed in [employer] record.  Physical Exam: General: Well developed , well nourished, no acute distress Head: Normocephalic and atraumatic Eyes:  sclerae anicteric, EOMI Ears: Normal auditory acuity Mouth: No deformity or lesions Neck: Supple, no masses or thyromegaly Lungs: Clear throughout to auscultation Heart: Regular rate and rhythm; no murmurs, rubs or bruits Abdomen: Soft, non tender and non distended. No masses, hepatosplenomegaly or hernias noted. Normal Bowel sounds Rectal: Deferred to colonoscopy Musculoskeletal: Symmetrical with no gross deformities  Skin: No lesions on visible extremities Pulses:  Normal pulses noted Extremities: No clubbing, cyanosis, edema or deformities noted Neurological: Alert oriented x 4, grossly nonfocal Cervical Nodes:  No significant cervical adenopathy Inguinal Nodes: No significant inguinal adenopathy Psychological:  Alert and cooperative. Normal mood and  affect  Assessment and Recommendations:  1. Hematochezia and iFOBT positive stool. I suspect she has a benign anorectal source of bleeding however we need to exclude colorectal neoplasms. Schedule colonoscopy. The risks, benefits, and alternatives to colonoscopy with possible biopsy and possible polypectomy were discussed with the patient and they consent to proceed.   2. B12 deficiency anemia. Replacement per Dr. Philip Aspen.

## 2014-05-12 ENCOUNTER — Encounter: Payer: Self-pay | Admitting: Gastroenterology

## 2014-05-12 DIAGNOSIS — D126 Benign neoplasm of colon, unspecified: Secondary | ICD-10-CM

## 2014-05-12 HISTORY — DX: Benign neoplasm of colon, unspecified: D12.6

## 2014-05-25 ENCOUNTER — Encounter: Payer: Self-pay | Admitting: Gastroenterology

## 2014-05-25 ENCOUNTER — Ambulatory Visit (AMBULATORY_SURGERY_CENTER): Payer: No Typology Code available for payment source | Admitting: Gastroenterology

## 2014-05-25 VITALS — BP 112/64 | HR 71 | Temp 98.9°F | Resp 17 | Ht 64.25 in | Wt 110.0 lb

## 2014-05-25 DIAGNOSIS — R195 Other fecal abnormalities: Secondary | ICD-10-CM

## 2014-05-25 DIAGNOSIS — K921 Melena: Secondary | ICD-10-CM

## 2014-05-25 DIAGNOSIS — D126 Benign neoplasm of colon, unspecified: Secondary | ICD-10-CM

## 2014-05-25 MED ORDER — SODIUM CHLORIDE 0.9 % IV SOLN
500.0000 mL | INTRAVENOUS | Status: DC
Start: 2014-05-25 — End: 2014-05-25

## 2014-05-25 NOTE — Progress Notes (Signed)
A/ox3 pleased with MAC, report to Kristen RN 

## 2014-05-25 NOTE — Op Note (Signed)
[place]  [address] [place], [id number]   COLONOSCOPY PROCEDURE REPORT  PATIENT: Tiffany Nguyen, Tiffany Nguyen  MR#: [id number] BIRTHDATE: [birth year] , 68  yrs. old GENDER: Female ENDOSCOPIST: Ladene Artist, MD, [facility] REFERRED TA:[id number] Philip Aspen, M.D. PROCEDURE DATE:  05/25/2014 PROCEDURE:   Colonoscopy with snare polypectomy First Screening Colonoscopy - Avg.  risk and is 50 yrs.  old or older - No.  Prior Negative Screening - Now for repeat screening. N/A  History of Adenoma - Now for follow-up colonoscopy & has been > or = to 3 yrs.  N/A  Polyps Removed Today? Yes. ASA CLASS:   Class II INDICATIONS:hematochezia and heme-positive stool. MEDICATIONS: MAC sedation, administered by CRNA and propofol (Diprivan) 140mg  IV DESCRIPTION OF PROCEDURE:   After the risks benefits and alternatives of the procedure were thoroughly explained, informed consent was obtained.  A digital rectal exam revealed no abnormalities of the rectum.   The [id number]  endoscope was introduced through the anus and advanced to the cecum, which was identified by both the appendix and ileocecal valve. No adverse events experienced.   The quality of the prep was good, using MoviPrep  The instrument was then slowly withdrawn as the colon was fully examined.  COLON FINDINGS: Two sessile polyps measuring 5-6 mm in size were found in the transverse colon.  A polypectomy was performed with a cold snare.  The resection was complete and the polyp tissue was completely retrieved.   The colon was otherwise normal.  There was no diverticulosis, inflammation, polyps or cancers unless previously stated.  Retroflexed views revealed internal hemorrhoids. The time to cecum=2 minutes 48 seconds.  Withdrawal time=8 minutes 54 seconds.  The scope was withdrawn and the procedure completed. COMPLICATIONS: There were no complications.  ENDOSCOPIC IMPRESSION: 1.   Two sessile polyps measuring 5-6 mm in the  transverse colon; polypectomy performed with a cold snare 2.   Small internal hemorrhoids  RECOMMENDATIONS: 1.  Await pathology results 2.  Repeat colonoscopy in 5 years if polyp(s) adenomatous; otherwise 10 years  eSigned:  Ladene Artist, MD, [facility] 05/25/2014 2:30 PM

## 2014-05-25 NOTE — Patient Instructions (Signed)
YOU HAD AN ENDOSCOPIC PROCEDURE TODAY AT THE [facility]: Refer to the procedure report that was given to you for any specific questions about what was found during the examination.  If the procedure report does not answer your questions, please call your gastroenterologist to clarify.  If you requested that your care partner not be given the details of your procedure findings, then the procedure report has been included in a sealed envelope for you to review at your convenience later.  YOU SHOULD EXPECT: Some feelings of bloating in the abdomen. Passage of more gas than usual.  Walking can help get rid of the air that was put into your GI tract during the procedure and reduce the bloating. If you had a lower endoscopy (such as a colonoscopy or flexible sigmoidoscopy) you may notice spotting of blood in your stool or on the toilet paper. If you underwent a bowel prep for your procedure, then you may not have a normal bowel movement for a few days.  DIET: Your first meal following the procedure should be a light meal and then it is ok to progress to your normal diet.  A half-sandwich or bowl of soup is an example of a good first meal.  Heavy or fried foods are harder to digest and may make you feel nauseous or bloated.  Likewise meals heavy in dairy and vegetables can cause extra gas to form and this can also increase the bloating.  Drink plenty of fluids but you should avoid alcoholic beverages for 24 hours.  ACTIVITY: Your care partner should take you home directly after the procedure.  You should plan to take it easy, moving slowly for the rest of the day.  You can resume normal activity the day after the procedure however you should NOT DRIVE or use heavy machinery for 24 hours (because of the sedation medicines used during the test).    SYMPTOMS TO REPORT IMMEDIATELY: A gastroenterologist can be reached at any hour.  During normal business hours, 8:30 AM to 5:00 PM Monday through Friday,  call [phone].  After hours and on weekends, please call the GI answering service at [phone] who will take a message and have the physician on call contact you.   Following lower endoscopy (colonoscopy or flexible sigmoidoscopy):  Excessive amounts of blood in the stool  Significant tenderness or worsening of abdominal pains  Swelling of the abdomen that is new, acute  Fever of 100F or higher  FOLLOW UP: If any biopsies were taken you will be contacted by phone or by letter within the next 1-3 weeks.  Call your gastroenterologist if you have not heard about the biopsies in 3 weeks.  Our staff will call the home number listed on your records the next business day following your procedure to check on you and address any questions or concerns that you may have at that time regarding the information given to you following your procedure. This is a courtesy call and so if there is no answer at the home number and we have not heard from you through the emergency physician on call, we will assume that you have returned to your regular daily activities without incident.  SIGNATURES/CONFIDENTIALITY: You and/or your care partner have signed paperwork which will be entered into your electronic medical record.  These signatures attest to the fact that that the information above on your After Visit Summary has been reviewed and is understood.  Full responsibility of the confidentiality of this   discharge information lies with you and/or your care-partner.  Await pathology results  Continue your normal medications  Please read over handouts about polyps, hemorrhoids and high fiber diets

## 2014-05-25 NOTE — Progress Notes (Signed)
Called to room to assist during endoscopic procedure.  Patient ID and intended procedure confirmed with present staff. Received instructions for my participation in the procedure from the performing physician.  

## 2014-05-25 NOTE — Progress Notes (Signed)
Pt's abdomen soft, easily palpable.  Denies need to pass air.  No c/o pain.  Pt told to drink warm liquids and ambulate if she feels cramping at home.  Understanding voiced

## 2014-05-26 ENCOUNTER — Telehealth: Payer: Self-pay | Admitting: *Deleted

## 2014-05-26 NOTE — Telephone Encounter (Signed)
  Follow up Call-  Call back number 05/25/2014  Post procedure Call Back phone  # [phone] work  Permission to leave phone message Yes     Patient questions:  Do you have a fever, pain , or abdominal swelling? No. Pain Score  0 *  Have you tolerated food without any problems? Yes.    Have you been able to return to your normal activities? Yes.    Do you have any questions about your discharge instructions: Diet   No. Medications  No. Follow up visit  No.  Do you have questions or concerns about your Care? No.  Actions: * If pain score is 4 or above: No action needed, pain <4.  Pt. Stated that reglan was on medication list, but she does not take it regularly. Pt. Wanted to know how long would it be before she started having normal bowel movements, explained to pt. It is a matter of her system getting food in it and processing and that should be a few days.pt. Stated that she got up walked yesterday and she is at work.

## 2014-05-31 ENCOUNTER — Ambulatory Visit (INDEPENDENT_AMBULATORY_CARE_PROVIDER_SITE_OTHER): Payer: No Typology Code available for payment source

## 2014-05-31 ENCOUNTER — Ambulatory Visit (INDEPENDENT_AMBULATORY_CARE_PROVIDER_SITE_OTHER): Payer: No Typology Code available for payment source | Admitting: Family Medicine

## 2014-05-31 VITALS — BP 110/60 | HR 62 | Temp 98.6°F | Resp 16 | Ht 64.8 in | Wt 110.8 lb

## 2014-05-31 DIAGNOSIS — M25471 Effusion, right ankle: Secondary | ICD-10-CM

## 2014-05-31 DIAGNOSIS — M25571 Pain in right ankle and joints of right foot: Secondary | ICD-10-CM

## 2014-05-31 DIAGNOSIS — M25579 Pain in unspecified ankle and joints of unspecified foot: Secondary | ICD-10-CM

## 2014-05-31 DIAGNOSIS — T148XXA Other injury of unspecified body region, initial encounter: Secondary | ICD-10-CM

## 2014-05-31 DIAGNOSIS — M25473 Effusion, unspecified ankle: Secondary | ICD-10-CM

## 2014-05-31 DIAGNOSIS — M25476 Effusion, unspecified foot: Secondary | ICD-10-CM

## 2014-05-31 NOTE — Progress Notes (Signed)
Chief Complaint:  Chief Complaint  Patient presents with  . Ankle Pain    x3 days; pt indicates she started to fell pain on 05/28/2014 and took Advil which seem to help; she states it hurts to bend and walk on it; pain is traveling upwards towards calf and thigh area  . Joint Swelling    x3 days; redness    HPI: Tiffany Nguyen is a 68 y.o. female who is here for  A 3 ay history of Right ankle pain and swelling , NKI, she has had no fevers or chills or warmth in the area. She has pain with weightbearing and also along her achilles radiating to the back of her leg. She has tried advil for this. No prior ankle or foot injuries. Has a hx of osteoporosis , no prior hx of gout. Her husband has gout so she knows what to look for. She denies twisting her ankle or wearning any new shoes that may have aggravated this.  No w/n/t   Past Medical History  Diagnosis Date  . Hyperlipidemia   . Anemia 01/25/12    Slight  . Carotid artery occlusion     right  . 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 Breast abscess 2005  . Osteoporosis   . Gastric polyp 2011    hyperplastic   Past Surgical History  Procedure Laterality Date  . Knee surgery Left 1994  . Parathyroidectomy  06/2007  . Tubal ligation  1983  . Appendectomy  01/08/2002   History   Social History  . Marital Status: Married    Spouse Name: N/A    Number of Children: 1  . Years of Education: N/A   Occupational History  .  [employer]   Social History Main Topics  . Smoking status: Never Smoker   . Smokeless tobacco: Never Used  . Alcohol Use: No  . Drug Use: No  . Sexual Activity: None   Other Topics Concern  . None   Social History Narrative  . None   Family History  Problem Relation Age of Onset  . COPD Father   . Hypertension Mother   . Pulmonary embolism Mother    Allergies  Allergen Reactions  . Hydrocodone-Ibuprofen Nausea And Vomiting  . Ranexa [Ranolazine] Nausea And Vomiting   Prior  to Admission medications   Medication Sig Start Date End Date Taking? Authorizing Provider  aspirin 81 MG tablet Take 81 mg by mouth daily.   Yes Historical Provider, MD  Calcium Carbonate-Vitamin D (CALTRATE 600+D PO) Take by mouth.   Yes Historical Provider, MD  cholecalciferol (VITAMIN D) 1000 UNITS tablet Take 1,000 Units by mouth daily.   Yes Historical Provider, MD  conjugated estrogens (PREMARIN) vaginal cream Use 1/2 g vaginally at bedtime three times weekly and pea size amount at the urethra 01/21/14  Yes Patricia Rolen-Grubb, FNP  simvastatin (ZOCOR) 40 MG tablet Take 40 mg by mouth every evening.   Yes Historical Provider, MD     ROS: The patient denies fevers, chills, night sweats, unintentional weight loss, chest pain, palpitations, wheezing, dyspnea on exertion, nausea, vomiting, abdominal pain, dysuria, hematuria, melena, numbness, weakness, or tingling.  All other systems have been reviewed and were otherwise negative with the exception of those mentioned in the HPI and as above.    PHYSICAL EXAM: Filed Vitals:   05/31/14 0858  BP: 110/60  Pulse: 62  Temp: 98.6 F (37 C)  Resp:  [place]:   05/31/14 0858  Height: 5' 4.8" (1.646 m)  Weight: 110 lb 12.8 oz (50.259 kg)   Body mass index is 18.55 kg/(m^2).  General: Alert, no acute distress HEENT:  Normocephalic, atraumatic, oropharynx patent. EOMI, PERRLA Cardiovascular:  Regular rate and rhythm, no rubs murmurs or gallops.  No Carotid bruits, radial pulse intact. No pedal edema.  Respiratory: Clear to auscultation bilaterally.  No wheezes, rales, or rhonchi.  No cyanosis, no use of accessory musculature GI: No organomegaly, abdomen is soft and non-tender, positive bowel sounds.  No masses. Skin: No rashes. Neurologic: Facial musculature symmetric. Psychiatric: Patient is appropriate throughout our interaction. Lymphatic: No cervical lymphadenopathy Musculoskeletal: Gait intact. SLightly antalgic at first  step.  Right ankle-medial and lateral ankle swelling Pain with inversion and dorsiflexion, + DP 5/5 strength, 2/2 knee and ankle, sensation intact.   LABS: Results for orders placed in visit on 05/10/14  IFOBT (OCCULT BLOOD)      Result Value Ref Range   IFOBT Positive       EKG/XRAY:   Primary read interpreted by Dr. Marin Comment at [facility] fx or dislocation   ASSESSMENT/PLAN: Encounter Diagnoses  Name Primary?  . Pain in joint, ankle and foot, right Yes  . Ankle swelling, right   . Sprain and strain    Low suspicion for gout since has pain with eversion and inversion ACE wrap, declined our sweedo due to cost RICE, NSAID and or tylenol prn F.u if would like uric acid level, she will need to jsut call so I can place a future order in Fu prn   Gross sideeffects, risk and benefits, and alternatives of medications d/w patient. Patient is aware that all medications have potential sideeffects and we are unable to predict every sideeffect or drug-drug interaction that may occur.  LE, Wallace, DO 05/31/2014 10:27 AM

## 2014-05-31 NOTE — Patient Instructions (Signed)

## 2014-06-01 ENCOUNTER — Encounter: Payer: Self-pay | Admitting: Gastroenterology

## 2014-06-07 ENCOUNTER — Ambulatory Visit
Admission: RE | Admit: 2014-06-07 | Discharge: 2014-06-07 | Disposition: A | Discharge status: Home / Self Care | Payer: No Typology Code available for payment source | Source: Ambulatory Visit

## 2014-06-07 ENCOUNTER — Encounter (INDEPENDENT_AMBULATORY_CARE_PROVIDER_SITE_OTHER): Payer: Self-pay

## 2014-06-07 DIAGNOSIS — Z1231 Encounter for screening mammogram for malignant neoplasm of breast: Secondary | ICD-10-CM

## 2014-06-29 ENCOUNTER — Encounter: Payer: Self-pay | Admitting: Vascular Surgery

## 2014-06-30 ENCOUNTER — Ambulatory Visit (INDEPENDENT_AMBULATORY_CARE_PROVIDER_SITE_OTHER): Payer: No Typology Code available for payment source | Admitting: Vascular Surgery

## 2014-06-30 ENCOUNTER — Encounter: Payer: Self-pay | Admitting: Vascular Surgery

## 2014-06-30 ENCOUNTER — Ambulatory Visit (HOSPITAL_COMMUNITY)
Admission: RE | Admit: 2014-06-30 | Discharge: 2014-06-30 | Disposition: A | Discharge status: Home / Self Care | Payer: No Typology Code available for payment source | Source: Ambulatory Visit | Attending: Vascular Surgery | Admitting: Vascular Surgery

## 2014-06-30 VITALS — BP 122/68 | HR 65 | Resp 16 | Ht 65.0 in | Wt 111.0 lb

## 2014-06-30 DIAGNOSIS — I6529 Occlusion and stenosis of unspecified carotid artery: Secondary | ICD-10-CM | POA: Diagnosis not present

## 2014-06-30 NOTE — Assessment & Plan Note (Signed)
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 Given that this is been stable for some time I think it is safe to continue her follow up at one year. I will see her back in one year with a follow duplex scan which I have ordered. In the meantime she remains on aspirin and a statin. She knows to call sooner if she has problems.

## 2014-06-30 NOTE — Progress Notes (Signed)
[facility] [place]  Patient name: Tiffany Nguyen MRN: [id number] DOB: Dec 10, 1945 Sex: female  REASON FOR VISIT: Follow up of carotid disease  HPI: Tiffany Nguyen is a 68 y.o. female who I last saw on 02/25/2013. She has known carotid disease. At the time of her last visit, she had a 60-79% left carotid stenosis with no significant stenosis on the right. The velocities did not change significantly over a year. 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  She comes in for a one year follow visit. She denies any history of stroke, TIAs, expressive or receptive aphasia, or amaurosis fugax. She is on aspirin and is on a statin.  Past Medical History  Diagnosis Date  . Hyperlipidemia   . Anemia 01/25/12    Slight  . Carotid artery occlusion     right  . 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 Breast abscess 2005  . Osteoporosis   . Gastric polyp 2011    hyperplastic   Family History  Problem Relation Age of Onset  . COPD Father   . Hypertension Mother   . Pulmonary embolism Mother    SOCIAL HISTORY: History  Substance Use Topics  . Smoking status: Never Smoker   . Smokeless tobacco: Never Used  . Alcohol Use: No   Allergies  Allergen Reactions  . Hydrocodone-Ibuprofen Nausea And Vomiting   Current Outpatient Prescriptions  Medication Sig Dispense Refill  . aspirin 81 MG tablet Take 81 mg by mouth daily.      . Calcium Carbonate-Vitamin D (CALTRATE 600+D PO) Take by mouth.      . cholecalciferol (VITAMIN D) 1000 UNITS tablet Take 1,000 Units by mouth daily.      Marland Kitchen conjugated estrogens (PREMARIN) vaginal cream Use 1/2 g vaginally at bedtime three times weekly and pea size amount at the urethra  42.5 g  3  . Cyanocobalamin (VITAMIN B 12 PO) Take 500 mg by mouth daily.      . simvastatin (ZOCOR) 40 MG tablet Take 40 mg by mouth every evening.       No current facility-administered medications for this visit.    REVIEW OF SYSTEMS: Valu.Nieves ] denotes positive finding; [  ] denotes negative finding  CARDIOVASCULAR:  [ ]  chest pain   [ ]  chest pressure   [ ]  palpitations   [ ]  orthopnea   [ ]  dyspnea on exertion   [ ]  claudication   [ ]  rest pain   [ ]  DVT   [ ]  phlebitis PULMONARY:   [ ]  productive cough   [ ]  asthma   [ ]  wheezing NEUROLOGIC:   [ ]  weakness  [ ]  paresthesias  [ ]  aphasia  [ ]  amaurosis  [ ]  dizziness HEMATOLOGIC:   [ ]  bleeding problems   [ ]  clotting disorders MUSCULOSKELETAL:  [ ]  joint pain   [ ]  joint swelling [ ]  leg swelling GASTROINTESTINAL: [ ]   blood in stool  [ ]   hematemesis GENITOURINARY:  [ ]   dysuria  [ ]   hematuria PSYCHIATRIC:  [ ]  history of major depression INTEGUMENTARY:  [ ]  rashes  [ ]  ulcers CONSTITUTIONAL:  [ ]  fever   [ ]  chills  PHYSICAL EXAM: Filed Vitals:   06/30/14 1634 06/30/14 1636  BP: 121/72 122/68  Pulse: 65 65  Resp:  16  Height:  5\' 5"  (1.651 m)  Weight:  111 lb (50.349  kg)  SpO2:  99%   Body mass index is 18.47 kg/(m^2). GENERAL: The patient is a well-nourished female, in no acute distress. The vital signs are documented above. CARDIOVASCULAR: There is a regular rate and rhythm. I do not detect carotid bruits. PULMONARY: There is good air exchange bilaterally without wheezing or rales. ABDOMEN: Soft and non-tender with normal pitched bowel sounds.  MUSCULOSKELETAL: There are no major deformities or cyanosis. NEUROLOGIC: No focal weakness or paresthesias are detected. SKIN: There are no ulcers or rashes noted. PSYCHIATRIC: The patient has a normal affect.  DATA:  I have independently interpreted her carotid duplex scan today. This shows a 60-79% left carotid stenosis with no significant stenosis on the right. 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 Given that this is been stable for some time I think it is safe to continue her follow up at one year. I will see her back in one year with a follow duplex scan which I have ordered. In the meantime she remains on aspirin and a statin. She knows to call sooner if she has problems.    Return in about 1 year (around 07/01/2015).  [facility] [place] Beeper: [phone]

## 2014-07-10 ENCOUNTER — Inpatient Hospital Stay
Admission: RE | Admit: 2014-07-10 | Discharge: 2014-07-10 | Disposition: A | Discharge status: Home / Self Care | Payer: Self-pay | Source: Ambulatory Visit | Attending: Internal Medicine | Admitting: Internal Medicine

## 2014-07-10 ENCOUNTER — Ambulatory Visit (INDEPENDENT_AMBULATORY_CARE_PROVIDER_SITE_OTHER): Payer: No Typology Code available for payment source | Admitting: Internal Medicine

## 2014-07-10 ENCOUNTER — Ambulatory Visit (INDEPENDENT_AMBULATORY_CARE_PROVIDER_SITE_OTHER): Payer: No Typology Code available for payment source

## 2014-07-10 VITALS — BP 90/60 | HR 88 | Temp 99.1°F | Resp 24 | Ht 64.0 in | Wt 109.4 lb

## 2014-07-10 DIAGNOSIS — M542 Cervicalgia: Secondary | ICD-10-CM

## 2014-07-10 DIAGNOSIS — M5030 Other cervical disc degeneration, unspecified cervical region: Secondary | ICD-10-CM

## 2014-07-10 MED ORDER — CYCLOBENZAPRINE HCL 5 MG PO TABS: 5.0000 mg | ORAL_TABLET | Freq: Every day | ORAL | Status: DC

## 2014-07-10 NOTE — Progress Notes (Addendum)
Subjective:  This chart was scribed for Tami Lin, MD by Donato Schultz, Medical Scribe. This patient was seen in Room 4 and the patient's care was started at 3:44 PM.   Patient ID: Tiffany Nguyen, female    DOB: 08/01/1946, 68 y.o.   MRN: [id number]  HPI HPI Comments: Tiffany Nguyen is a 68 y.o. female who presents to the [facility] complaining of constant neck pain radiating down her right arm that started yesterday morning as she was getting ready for work.  Moving her head aggravates the pain.  She denies rash, numbness in her fingers, trouble swallowing, and weak grip strength as associated symptoms.  She was unable to sleep laying down due to pain.  She has experienced these symptoms years ago and was diagnosed with a bone spur, but no troubling years   She is currently taking simvastatin as her only prescribed medication.   She had a carotid ultrasound 2 weeks ago= negative  Patient Active Problem List   Diagnosis Date Noted  . Osteoporosis 01/20/2014  . Dyspareunia 01/20/2014  . Well woman exam with routine gynecological exam 01/20/2014  . Unspecified vitamin D deficiency 01/20/2014  . Postmenopausal atrophic vaginitis 01/20/2014  . Occlusion and stenosis of carotid artery without mention of cerebral infarction 01/30/2012    Past Medical History  Diagnosis Date  . Hyperlipidemia   . Anemia 01/25/12    Slight  . Carotid artery occlusion     right  . 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 Breast abscess 2005  . Osteoporosis   . Gastric polyp 2011    hyperplastic   Past Surgical History  Procedure Laterality Date  . Knee surgery Left 1994  . Parathyroidectomy  06/2007  . Tubal ligation  1983  . Appendectomy  01/08/2002     Allergies  Allergen Reactions  . Hydrocodone-Ibuprofen Nausea And Vomiting    Review of Systems  HENT: Negative for trouble swallowing.   Musculoskeletal: Positive for arthralgias.  Skin: Negative for rash.    Neurological: Negative for weakness and numbness.   no headaches no dizziness No shoulder pain   Objective:  Physical Exam  Nursing note and vitals reviewed. Constitutional: She is oriented to person, place, and time. She appears well-developed and well-nourished. No distress.  HENT:  Head: Normocephalic and atraumatic.  Right Ear: External ear normal.  Left Ear: External ear normal.  Mouth/Throat: Oropharynx is clear and moist.  Eyes: Conjunctivae and EOM are normal. Pupils are equal, round, and reactive to light.  Neck: Neck supple.  Extension, flexion, and range of motion all produce pain in the right posterior cervical area without particular radiation She is tender to palpation over the right posterior cervical area into the right trapezius and in the right pectoralis area as well  Cardiovascular: Normal rate, regular rhythm and normal heart sounds.  Exam reveals no gallop and no friction rub.   No murmur heard. There is a soft left carotid bruit/no bruit on the right  Pulmonary/Chest: Effort normal and breath sounds normal. No respiratory distress. She has no wheezes. She has no rales.  Musculoskeletal:  No hyperesthesia to light palpation over the posterior cervical area, the trapezius, or the pectoral area on the right//no rash present The shoulder has a full range of motion without pain There is no weakness or sensory loss in the right extremity   Neurological: She is alert and oriented to person, place, and time. She has normal  reflexes. No cranial nerve deficit. She exhibits normal muscle tone.  Skin: No rash noted.  Psychiatric: She has a normal mood and affect. Her behavior is normal.   [facility] reading (PRIMARY) by  Dr. Stasia Cavalier fx/lesion other than deg changes/loss nl lordosis   BP 90/60  Pulse 88  Temp(Src) 99.1 F (37.3 C) (Oral)  Resp 24  Ht 5\' 4"  (1.626 m)  Wt 109 lb 6 oz (49.612 kg)  BMI 18.76 kg/m2  SpO2 97%  LMP 11/12/1996 Assessment & Plan:    Neck pain -muscle spasm 2 to DDD Heat  Flex 5 hs Tylenol 650 qid rom ex Call tues if not resp for ref to PT   I personally performed the services described in this documentation, which was scribed in my presence. The recorded information has been reviewed and is accurate.

## 2014-07-11 ENCOUNTER — Telehealth: Payer: Self-pay

## 2014-07-11 NOTE — Telephone Encounter (Signed)
Patient says the Tramadol is not giving her any relief and Tylenol is not either. Wants to know if she can increase the dosage or if something else can be sent in.

## 2014-07-12 NOTE — Telephone Encounter (Signed)
Not feeling well,neck is still painful   Neck pain, tylenol and heat have not helped  Saw regular PCP today and received a new medication.

## 2014-07-12 NOTE — Telephone Encounter (Signed)
Calls to check on progress- is she still not responding to medication and heat? If not it is okay to call in tramadol one tablet every 6 hours #8 and schedule her to be seen by [facility] orthopedics this week

## 2014-07-22 ENCOUNTER — Encounter: Payer: Self-pay | Admitting: Nurse Practitioner

## 2014-07-22 ENCOUNTER — Ambulatory Visit (INDEPENDENT_AMBULATORY_CARE_PROVIDER_SITE_OTHER): Payer: No Typology Code available for payment source | Admitting: Nurse Practitioner

## 2014-07-22 VITALS — BP 136/84 | HR 88 | Ht 64.0 in | Wt 109.0 lb

## 2014-07-22 DIAGNOSIS — Q519 Congenital malformation of uterus and cervix, unspecified: Secondary | ICD-10-CM

## 2014-07-22 DIAGNOSIS — Q524 Other congenital malformations of vagina: Secondary | ICD-10-CM

## 2014-07-22 DIAGNOSIS — N952 Postmenopausal atrophic vaginitis: Secondary | ICD-10-CM

## 2014-07-22 DIAGNOSIS — Q525 Fusion of labia: Secondary | ICD-10-CM

## 2014-07-22 DIAGNOSIS — Q5270 Unspecified congenital malformations of vulva: Secondary | ICD-10-CM

## 2014-07-22 NOTE — Progress Notes (Signed)
Subjective:     Patient ID: Tiffany Nguyen, female   DOB: November 29, 1945, 68 y.o.   MRN: [id number]  HPI 68 y.o. [id number] Married Caucasian Fe here for a recheck on vaginal atrophy.  She had her annual exam in March was found to be profoundly atrophic and fusion of labia secondary to lack of vaginal estrogen.   She was given an Rx for Premarin vaginal cream. She used the cream for a short time but then had a + IFOB.  She then went through a repeat colonoscopy and was found to have 3 polyps.  She then forgot to use the cream and now has reconsidered and does not want the estrogen vaginal cream.  She wants to consider other options.   Postmenopausal since 1998 and took HRT for about 5 years then off. Very uncomfortable and pain with SA. Husband also with ED problems and SA is not very frequent.    Review of Systems  Constitutional: Negative for fever, chills and fatigue.  Gastrointestinal: Negative for nausea, vomiting, abdominal pain, diarrhea and constipation.  Genitourinary: Positive for dyspareunia. Negative for urgency, frequency, hematuria, flank pain, vaginal bleeding, vaginal discharge, genital sores, vaginal pain and pelvic pain.  Musculoskeletal: Negative.   Skin: Negative.   Neurological: Negative.   Psychiatric/Behavioral: Negative.        Objective:   Physical Exam  Constitutional: She appears well-developed and well-nourished.  Abdominal: Soft. She exhibits no distension. There is no tenderness. There is no rebound.  Genitourinary:     Labial fusion is still present and still very atrophic.  The urethra maybe a little prolapsed.  No pain over the bladder or bimanual pain.       Assessment:     Atrophic vaginitis - now off vaginal estrogen    Plan:     Will have her to use a pea size amount only once a week if she feels comfortable with this. She may also use EVOO or coconut oil prn Other options that she has tried in the past with Replens is OK.

## 2014-07-22 NOTE — Patient Instructions (Signed)
Use hormone cream pea size amount once weekly as directed

## 2014-07-25 NOTE — Progress Notes (Signed)
Encounter reviewed by Dr. Josefa Half.  Consider biopsy to rule out lichen sclerosus.

## 2014-09-13 ENCOUNTER — Encounter: Payer: Self-pay | Admitting: Nurse Practitioner

## 2014-09-23 ENCOUNTER — Telehealth: Payer: Self-pay | Admitting: Nurse Practitioner

## 2014-09-23 NOTE — Telephone Encounter (Signed)
Called patient to get an update after her last visit.  She had bad vulvitis. Dr. Quincy Simmonds is recommending a vulvar biopsy to R/O LSA.  This of course was not discussed at [place] but she was to let us know if no better.

## 2014-09-30 NOTE — Telephone Encounter (Signed)
I have attempted to contact this patient by phone with the following results: left message to return my call on answering machine (home).  

## 2014-10-01 NOTE — Telephone Encounter (Signed)
Returned call to patient.  She is really unable to talk at work.  She says things are maybe better, but she is not using the Premarin cream.  She would like to talk to [address], [place].  Pt will call back Monday at 8:30am when she is able to speak more openly and will ask for Patty.

## 2014-10-01 NOTE — Telephone Encounter (Signed)
Pt returning call

## 2014-10-04 NOTE — Telephone Encounter (Signed)
Patient calls back and states no flare of vulvar irritation now and is using extra virgin olive oil three times weekly.  She is now not having any symptoms but will call back if problems to schedule.  Very appreciative of our care.

## 2015-01-07 ENCOUNTER — Ambulatory Visit (HOSPITAL_COMMUNITY)
Admission: RE | Admit: 2015-01-07 | Discharge: 2015-01-07 | Disposition: A | Discharge status: Home / Self Care | Payer: No Typology Code available for payment source | Source: Ambulatory Visit | Attending: Internal Medicine | Admitting: Internal Medicine

## 2015-01-07 ENCOUNTER — Other Ambulatory Visit (HOSPITAL_COMMUNITY): Payer: Self-pay | Admitting: Internal Medicine

## 2015-01-07 DIAGNOSIS — N200 Calculus of kidney: Secondary | ICD-10-CM | POA: Insufficient documentation

## 2015-01-07 DIAGNOSIS — R109 Unspecified abdominal pain: Secondary | ICD-10-CM

## 2015-01-25 ENCOUNTER — Ambulatory Visit: Payer: No Typology Code available for payment source | Admitting: Nurse Practitioner

## 2015-01-31 ENCOUNTER — Encounter: Payer: Self-pay | Admitting: Nurse Practitioner

## 2015-01-31 ENCOUNTER — Ambulatory Visit (INDEPENDENT_AMBULATORY_CARE_PROVIDER_SITE_OTHER): Payer: No Typology Code available for payment source | Admitting: Nurse Practitioner

## 2015-01-31 VITALS — BP 100/70 | HR 70 | Resp 14 | Ht 64.0 in | Wt 106.8 lb

## 2015-01-31 DIAGNOSIS — Z0000 Encounter for general adult medical examination without abnormal findings: Secondary | ICD-10-CM

## 2015-01-31 DIAGNOSIS — Z01419 Encounter for gynecological examination (general) (routine) without abnormal findings: Secondary | ICD-10-CM | POA: Diagnosis not present

## 2015-01-31 LAB — POCT URINALYSIS DIPSTICK
Leukocytes, UA: NEGATIVE
Urobilinogen, UA: NEGATIVE
pH, UA: 5

## 2015-01-31 MED ORDER — ESTROGENS, CONJUGATED 0.625 MG/GM VA CREA: TOPICAL_CREAM | VAGINAL | Status: DC

## 2015-01-31 NOTE — Patient Instructions (Signed)

## 2015-01-31 NOTE — Progress Notes (Signed)
69 y.o. G19P1001 Married  Caucasian Fe here for annual exam.  She feels well.  Rarely SA with husbands problem of ED.  Sees Dr. Sharlett Iles this spring and plans to get a repeat BMD in the fall.  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  Patient's last menstrual period was 11/12/1996.          Sexually active: No.  The current method of family planning is post menopausal status.    Exercising: Yes.    Walking most of the time Smoker:  no  Health Maintenance: Pap:  01/20/14 Negative, no history of abnormal MMG:   06/07/14 Breast Density Category C; Bi-Rads 1: Negative Colonoscopy:  05/25/14  pre- cancerous Polyps X 3, had + IFOB,  f/u in 2020  BMD:  2014- osteopenia TDaP:  2013  Shingles vaccine:  2008  Labs done by Bevelyn Buckles, MD ; Urine: Negative   reports that she has never smoked. She has never used smokeless tobacco. She reports that she does not drink alcohol or use illicit drugs.  Past Medical History  Diagnosis Date  . Hyperlipidemia   . Anemia 01/25/12    Slight  . Carotid artery occlusion     right  . 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 Breast abscess 2005  . Osteoporosis   . Gastric polyp 2011    hyperplastic    Past Surgical History  Procedure Laterality Date  . Knee surgery Left 1994  . Parathyroidectomy  06/2007  . Tubal ligation  1983  . Appendectomy  01/08/2002    Current Outpatient Prescriptions  Medication Sig Dispense Refill  . aspirin 81 MG tablet Take 81 mg by mouth daily.    . Calcium Carbonate-Vitamin D (CALTRATE 600+D PO) Take by mouth.    . cholecalciferol (VITAMIN D) 1000 UNITS tablet Take 1,000 Units by mouth daily.    Marland Kitchen conjugated estrogens (PREMARIN) vaginal cream Use 1/2 g vaginally at bedtime three times weekly and pea size amount at the urethra 42.5 g 3  . cyclobenzaprine (FLEXERIL) 5 MG tablet Take 1 tablet (5 mg total) by mouth at bedtime. As needed for neck pain (Patient not taking: Reported on  01/31/2015) 14 tablet 0  . simvastatin (ZOCOR) 40 MG tablet Take 40 mg by mouth every evening.     No current facility-administered medications for this visit.    Family History  Problem Relation Age of Onset  . COPD Father   . Hypertension Mother   . Pulmonary embolism Mother     ROS:  Pertinent items are noted in HPI.  Otherwise, a comprehensive ROS was negative.  Exam:   BP 100/70 mmHg  Pulse 70  Resp 14  Ht 5\' 4"  (1.626 m)  Wt 106 lb 12.8 oz (48.444 kg)  BMI 18.32 kg/m2  LMP 11/12/1996 Height: 5\' 4"  (162.6 cm) Ht Readings from Last 3 Encounters:  01/31/15 5\' 4"  (1.626 m)  07/22/14 5\' 4"  (1.626 m)  07/10/14 5\' 4"  (1.626 m)    General appearance: alert, cooperative and appears stated age Head: Normocephalic, without obvious abnormality, atraumatic Neck: no adenopathy, supple, symmetrical, trachea midline and thyroid normal to inspection and palpation Lungs: clear to auscultation bilaterally Breasts: normal appearance, no masses or tenderness Heart: regular rate and rhythm Abdomen: soft, non-tender; no masses,  no organomegaly Extremities: extremities normal, atraumatic, no cyanosis or edema Skin: Skin color, texture, turgor normal. No rashes or lesions, vitiligo skin color changes  Lymph nodes: Cervical, supraclavicular, and axillary nodes normal. No abnormal inguinal nodes palpated Neurologic: Grossly normal   Pelvic: External genitalia:  Labial fusion and extreme atrophy, also vitiligo color changes              Urethra:  normal appearing urethra with no masses, tenderness or lesions              Bartholin's and Skene's: normal                 Vagina: atrophic appearing vagina with pale color and discharge, no lesions              Cervix: anteverted              Pap taken: No. Bimanual Exam:  Uterus:  normal size, contour, position, consistency, mobility, non-tender              Adnexa: no mass, fullness, tenderness               Rectovaginal: Confirms                Anus:  normal sphincter tone, no lesions  Chaperone present: No  A:  Well Woman with normal exam  Postmenopausal HRT X 5 years 1998- 2003 Atrophic vaginitis with labial fusion - declines use of vaginal estrogen except for only prn, using OTC lubrication at the vulva Dyspareunia  Osteoporosis off Fosamax - for 7 years  P:   Reviewed health and wellness pertinent to exam  Pap smear not taken today  Mammogram is due 7/16  Refilled Premarin to use pea size amount at the urethra prn  Counseled on breast self exam, mammography screening, use and side effects of HRT, adequate intake of calcium and vitamin D, diet and exercise return annually or prn  An After Visit Summary was printed and given to the patient.  Will get BMD results from PCP

## 2015-02-03 NOTE — Progress Notes (Signed)
Encounter reviewed by Dr. Kron Everton Silva.  

## 2015-03-01 ENCOUNTER — Telehealth: Payer: Self-pay | Admitting: Nurse Practitioner

## 2015-03-01 NOTE — Telephone Encounter (Signed)
Left message regarding upcoming appointment has been canceled and needs to be rescheduled. °

## 2015-05-09 ENCOUNTER — Other Ambulatory Visit: Payer: Self-pay

## 2015-05-09 DIAGNOSIS — Z1231 Encounter for screening mammogram for malignant neoplasm of breast: Secondary | ICD-10-CM

## 2015-06-10 ENCOUNTER — Ambulatory Visit
Admission: RE | Admit: 2015-06-10 | Discharge: 2015-06-10 | Disposition: A | Discharge status: Home / Self Care | Payer: No Typology Code available for payment source | Source: Ambulatory Visit

## 2015-06-10 DIAGNOSIS — Z1231 Encounter for screening mammogram for malignant neoplasm of breast: Secondary | ICD-10-CM

## 2015-07-05 ENCOUNTER — Encounter: Payer: Self-pay | Admitting: Vascular Surgery

## 2015-07-06 ENCOUNTER — Ambulatory Visit (INDEPENDENT_AMBULATORY_CARE_PROVIDER_SITE_OTHER): Payer: No Typology Code available for payment source | Admitting: Family

## 2015-07-06 ENCOUNTER — Encounter: Payer: Self-pay | Admitting: Family

## 2015-07-06 ENCOUNTER — Ambulatory Visit (HOSPITAL_COMMUNITY)
Admission: RE | Admit: 2015-07-06 | Discharge: 2015-07-06 | Disposition: A | Discharge status: Home / Self Care | Payer: No Typology Code available for payment source | Source: Ambulatory Visit | Attending: Vascular Surgery | Admitting: Vascular Surgery

## 2015-07-06 VITALS — BP 121/73 | HR 66 | Temp 98.8°F | Ht 64.0 in | Wt 107.0 lb

## 2015-07-06 DIAGNOSIS — I6522 Occlusion and stenosis of left carotid artery: Secondary | ICD-10-CM | POA: Diagnosis not present

## 2015-07-06 DIAGNOSIS — I773 Arterial fibromuscular dysplasia: Secondary | ICD-10-CM | POA: Diagnosis not present

## 2015-07-06 DIAGNOSIS — I6523 Occlusion and stenosis of bilateral carotid arteries: Secondary | ICD-10-CM | POA: Insufficient documentation

## 2015-07-06 NOTE — Progress Notes (Signed)
Established Carotid Patient   History of Present Illness  Tiffany Nguyen is a 69 y.o. female patient of Dr. Scot Dock who has known carotid artery disease. At the time of her last visit, she had a 60-79% left carotid stenosis with no significant stenosis on the right. The velocities did not change significantly over a year. 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  She comes in for a one year follow up visit. She denies any history of stroke, TIAs, expressive or receptive aphasia, or amaurosis fugax. She is on aspirin and is on a statin.  Pt denies any problems with high blood pressure.  The patient reports New Medical or Surgical History: she needs B12 and is receiving replacement.  Pt Diabetic: no Pt smoker: non-smoker  Pt meds include: Statin : yes ASA: yes Other anticoagulants/antiplatelets: no   Past Medical History  Diagnosis Date  . Hyperlipidemia   . Anemia 01/25/12    Slight  . Carotid artery occlusion     right  . 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 Breast abscess 2005  . Osteoporosis   . Gastric polyp 2011    hyperplastic    Social History Social History  Substance Use Topics  . Smoking status: Never Smoker   . Smokeless tobacco: Never Used  . Alcohol Use: No    Family History Family History  Problem Relation Age of Onset  . COPD Father   . Hypertension Mother   . Pulmonary embolism Mother     Surgical History Past Surgical History  Procedure Laterality Date  . Knee surgery Left 1994  . Parathyroidectomy  06/2007  . Tubal ligation  1983  . Appendectomy  01/08/2002    Allergies  Allergen Reactions  . Hydrocodone-Ibuprofen Nausea And Vomiting    Current Outpatient Prescriptions  Medication Sig Dispense Refill  . aspirin 81 MG tablet Take 81 mg by mouth daily.    . Calcium Carbonate-Vitamin D (CALTRATE 600+D PO) Take by mouth.    . cholecalciferol (VITAMIN D) 1000 UNITS tablet Take 1,000 Units by mouth  daily.    . cyanocobalamin 500 MCG tablet Take 500 mcg by mouth daily.    . simvastatin (ZOCOR) 40 MG tablet Take 40 mg by mouth every evening.    . conjugated estrogens (PREMARIN) vaginal cream Use 1/2 g vaginally at bedtime three times weekly and pea size amount at the urethra (Patient not taking: Reported on 07/06/2015) 42.5 g 3  . cyclobenzaprine (FLEXERIL) 5 MG tablet Take 1 tablet (5 mg total) by mouth at bedtime. As needed for neck pain (Patient not taking: Reported on 01/31/2015) 14 tablet 0   No current facility-administered medications for this visit.    Review of Systems : See HPI for pertinent positives and negatives.  Physical Examination  Filed Vitals:   07/06/15 1003 07/06/15 1005  BP: 119/71 121/73  Pulse: 66   Temp: 98.8 F (37.1 C)   TempSrc: Oral   Height: 5\' 4"  (1.626 m)   Weight: 107 lb (48.535 kg)   SpO2: 100%    Body mass index is 18.36 kg/(m^2).  General: WDWN female in NAD GAIT: normal Eyes: PERRLA Pulmonary:  Non-labored, CTAB, no  Rales, no rhonchi, & no wheezing.  Cardiac: regular rhythm,  no detected murmur.  VASCULAR EXAM Carotid Bruits Right Left   Negative Positive    Aorta is not palpable. Radial pulses are 2+ palpable and equal.  LE Pulses Right Left       POPLITEAL  not palpable   not palpable       POSTERIOR TIBIAL  2+ palpable   2+ palpable        DORSALIS PEDIS      ANTERIOR TIBIAL 2+ palpable  1+ palpable     Gastrointestinal: soft, nontender, BS WNL, no r/g,  no palpable masses.  Musculoskeletal: no muscle atrophy/wasting. M/S 5/5 throughout, extremities without ischemic changes.  Neurologic: A&O X 3; Appropriate Affect, Speech is normal CN 2-12 intact, pain and light touch intact in extremities, Motor exam as listed above.   Non-Invasive Vascular Imaging CAROTID DUPLEX 07/06/2015   CEREBROVASCULAR  DUPLEX EVALUATION    INDICATION: Carotid disease    PREVIOUS INTERVENTION(S):     DUPLEX EXAM:     RIGHT  LEFT  Peak Systolic Velocities (cm/s) End Diastolic Velocities (cm/s) Plaque LOCATION Peak Systolic Velocities (cm/s) End Diastolic Velocities (cm/s) Plaque  102 36  CCA PROXIMAL 88 24   97 36  CCA MID 79 26   77 27  CCA DISTAL 74 28   77 20  ECA 70 11   70 19  ICA PROXIMAL 78 26   88 39  ICA MID 222 73   67 30  ICA DISTAL 86 33     0.91 ICA / CCA Ratio (PSV) 3.0  Antegrade Vertebral Flow Antegrade  202 Brachial Systolic Pressure (mmHg) 542  Multiphasic (subclavian artery) Brachial Artery Waveforms Multiphasic (subclavian artery)    Plaque Morphology:  HM = Homogeneous, HT = Heterogeneous, CP = Calcific Plaque, SP = Smooth Plaque, IP = Irregular Plaque     ADDITIONAL FINDINGS: . No significant stenosis of the bilateral external or common carotid arteries. . Elevated Doppler velocities and flow pattern characteristics of the left mid internal carotid artery appear to be consistent with fibromuscular dysplasia.    IMPRESSION: 1. No evidence of stenosis noted in the bilateral carotid arteries. 2. 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    Compared to the previous exam:  No significant change noted when compared to the previous exam on 06/30/14.        Assessment: Tiffany Nguyen is a 69 y.o. female who has no history of stroke or TIA. Today's carotid Duplex suggests no evidence of stenosis noted in the bilateral carotid arteries. Elevated Doppler velocities and flow pattern characteristics of the left mid internal carotid artery appear to be consistent with fibromuscular dysplasia. No significant change noted when compared to the previous exam on 06/30/14.     Plan: Follow-up in 1 year with Carotid Duplex.  Pt was given printed information from the F[employer] Society.   I discussed in depth with the patient the nature of  atherosclerosis, and emphasized the importance of maximal medical management including strict control of blood pressure, blood glucose, and lipid levels, obtaining regular exercise, and continued cessation of smoking.  The patient is aware that without maximal medical management the underlying atherosclerotic disease process will progress, limiting the benefit of any interventions. The patient was given information about stroke prevention and what symptoms should prompt the patient to seek immediate medical care. Thank you for allowing [place] to participate in this patient's care.  Clemon Chambers, RN, MSN, FNP-C [facility] Office: [phone]  Clinic Physician: Scot Dock  07/06/2015 10:11 AM

## 2016-01-04 DIAGNOSIS — Z681 Body mass index (BMI) 19 or less, adult: Secondary | ICD-10-CM | POA: Diagnosis not present

## 2016-01-04 DIAGNOSIS — M810 Age-related osteoporosis without current pathological fracture: Secondary | ICD-10-CM | POA: Diagnosis not present

## 2016-02-01 ENCOUNTER — Ambulatory Visit (INDEPENDENT_AMBULATORY_CARE_PROVIDER_SITE_OTHER): Payer: PPO | Admitting: Nurse Practitioner

## 2016-02-01 ENCOUNTER — Encounter: Payer: Self-pay | Admitting: Nurse Practitioner

## 2016-02-01 VITALS — BP 110/66 | HR 72 | Ht 64.0 in | Wt 106.0 lb

## 2016-02-01 DIAGNOSIS — Z01419 Encounter for gynecological examination (general) (routine) without abnormal findings: Secondary | ICD-10-CM

## 2016-02-01 DIAGNOSIS — Q525 Fusion of labia: Secondary | ICD-10-CM | POA: Diagnosis not present

## 2016-02-01 DIAGNOSIS — N952 Postmenopausal atrophic vaginitis: Secondary | ICD-10-CM

## 2016-02-01 DIAGNOSIS — Z0000 Encounter for general adult medical examination without abnormal findings: Secondary | ICD-10-CM

## 2016-02-01 DIAGNOSIS — N904 Leukoplakia of vulva: Secondary | ICD-10-CM

## 2016-02-01 MED ORDER — NYSTATIN 100000 UNIT/GM EX CREA: 1.0000 "application " | TOPICAL_CREAM | Freq: Two times a day (BID) | CUTANEOUS | Status: DC

## 2016-02-01 MED ORDER — ESTROGENS, CONJUGATED 0.625 MG/GM VA CREA: TOPICAL_CREAM | VAGINAL | Status: DC

## 2016-02-01 MED ORDER — TRIAMCINOLONE ACETONIDE 0.025 % EX OINT: 1.0000 "application " | TOPICAL_OINTMENT | Freq: Two times a day (BID) | CUTANEOUS | Status: DC

## 2016-02-01 NOTE — Patient Instructions (Addendum)

## 2016-02-01 NOTE — Progress Notes (Signed)
70 y.o. G68P1001 Married  Caucasian Fe here for annual exam.  Since last year has restarted on Fosamax in December due to decrease in BMD.  She had prior use of Fosamax and has been off for about 7-8 yrs until the restart.  She feels well, husband is doing well.  Patient's last menstrual period was 11/12/1996 (approximate).          Sexually active: No.  The current method of family planning is post menopausal status.    Exercising: Yes.    walking, but not as regularly as in the past Smoker:  no  Health Maintenance: Pap: 3/11/115 negative, no history of abnormal MMG:  06/10/15, Bi-Rads 1:  Negative Colonoscopy:  05/25/14, polyp, repeat in 5 years, Dr. Fuller Plan BMD:   08/02/15 T Score, -1.6 Spine / -3.1 Right Femur Neck / -3.0 Left Femur Neck TDaP:  2013 Shingles: 2008 Pneumonia: has had one vaccine, will talk to Dr. Philip Aspen about Prevnar 13 at visit in 02/2016 Hep C:  To be done at PCP Labs: PCP takes care of all labs including Vit D   reports that she has never smoked. She has never used smokeless tobacco. She reports that she does not drink alcohol or use illicit drugs.  Past Medical History  Diagnosis Date  . Hyperlipidemia   . Anemia 01/25/12    Slight  . Carotid artery occlusion     right  . 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 Breast abscess 2005  . Osteoporosis   . Gastric polyp 2011    hyperplastic    Past Surgical History  Procedure Laterality Date  . Knee surgery Left 1994  . Parathyroidectomy  06/2007  . Tubal ligation  1983  . Appendectomy  01/08/2002    Current Outpatient Prescriptions  Medication Sig Dispense Refill  . alendronate (FOSAMAX) 70 MG tablet Take 1 tablet by mouth once a week.    Marland Kitchen aspirin 81 MG tablet Take 81 mg by mouth daily.    . Calcium Carbonate-Vitamin D (CALTRATE 600+D PO) Take by mouth.    . cholecalciferol (VITAMIN D) 1000 UNITS tablet Take 1,000 Units by mouth daily.    Marland Kitchen conjugated estrogens (PREMARIN) vaginal cream Use 1/2 g  vaginally at bedtime three times weekly and pea size amount at the urethra 42.5 g 3  . cyanocobalamin 500 MCG tablet Take 500 mcg by mouth daily. Vitamin B12.    . simvastatin (ZOCOR) 40 MG tablet Take 40 mg by mouth every evening.    . nystatin cream (MYCOSTATIN) Apply 1 application topically 2 (two) times daily. Apply to affected area BID for up to 7 days. 30 g 1  . triamcinolone (KENALOG) 0.025 % ointment Apply 1 application topically 2 (two) times daily. 30 g 1   No current facility-administered medications for this visit.    Family History  Problem Relation Age of Onset  . COPD Father   . Hypertension Mother   . Pulmonary embolism Mother     ROS:  Pertinent items are noted in HPI.  Otherwise, a comprehensive ROS was negative.  Exam:   BP 110/66 mmHg  Pulse 72  Ht 5\' 4"  (1.626 m)  Wt 106 lb (48.081 kg)  BMI 18.19 kg/m2  LMP 11/12/1996 (Approximate) Height: 5\' 4"  (162.6 cm) Ht Readings from Last 3 Encounters:  02/01/16 5\' 4"  (1.626 m)  07/06/15 5\' 4"  (1.626 m)  01/31/15 5\' 4"  (1.626 m)    General appearance: alert, cooperative and appears stated  age Head: Normocephalic, without obvious abnormality, atraumatic Neck: no adenopathy, supple, symmetrical, trachea midline and thyroid normal to inspection and palpation Lungs: clear to auscultation bilaterally Breasts: normal appearance, no masses or tenderness Heart: regular rate and rhythm Abdomen: soft, non-tender; no masses,  no organomegaly Extremities: extremities normal, atraumatic, no cyanosis or edema Skin: Skin color, texture, turgor normal. No rashes or lesions Lymph nodes: Cervical, supraclavicular, and axillary nodes normal. No abnormal inguinal nodes palpated Neurologic: Grossly normal   Pelvic: External genitalia:  Labial fusion and extreme atrophy, vitiligo color changes and LSA changes.  She has redness from the introitus to the rectum.              Urethra:  normal appearing urethra with no masses,  tenderness or lesions              Bartholin's and Skene's: normal                 Vagina: normal appearing vagina with normal color and discharge, no lesions              Cervix: anteverted              Pap taken: No. Bimanual Exam:  Uterus:  normal size, contour, position, consistency, mobility, non-tender              Adnexa: no mass, fullness, tenderness               Rectovaginal: Confirms               Anus:  normal sphincter tone, no lesions  Chaperone present: yes  A:  Well Woman with normal exam  Postmenopausal HRT X 5 years 1998- 2003 Atrophic vaginitis with labial fusion - declines use of vaginal estrogen except for only  prn, using OTC lubrication at the vulva  History of LSA biopsy proven 08/11/2010 Dyspareunia  Osteoporosis back on Fosamax - since 10/2015 - had been off for about 7-8 yrs   P:   Reviewed health and wellness pertinent to exam  Pap smear as above  Mammogram is due 05/2016  Will give her Nystatin and Triamcinolone cream to use externally prn for comfort and decrease itching.  She must continue with lubrication daily.  Only using Premarin cream rare to none.  If the external symptoms are not improved may need Temovate - she will call.  Counseled on breast self exam, mammography screening, adequate intake of calcium and vitamin D, diet and exercise, Kegel's exercises return annually or prn  An After Visit Summary was printed and given to the patient.

## 2016-02-02 ENCOUNTER — Telehealth: Payer: Self-pay | Admitting: Nurse Practitioner

## 2016-02-02 ENCOUNTER — Ambulatory Visit: Payer: No Typology Code available for payment source | Admitting: Nurse Practitioner

## 2016-02-02 NOTE — Telephone Encounter (Signed)
Patient is calling to let PG know the immunizations she had done. Pneumonia vaccine 10/23/11 and the Shingles vaccine 04/03/07

## 2016-02-02 NOTE — Progress Notes (Signed)
Encounter reviewed by Dr. Chimaobi Casebolt Amundson C. Silva.  

## 2016-02-02 NOTE — Telephone Encounter (Signed)
Many thanks I have documented this in her chart.

## 2016-02-23 DIAGNOSIS — D6489 Other specified anemias: Secondary | ICD-10-CM | POA: Diagnosis not present

## 2016-02-23 DIAGNOSIS — E784 Other hyperlipidemia: Secondary | ICD-10-CM | POA: Diagnosis not present

## 2016-02-23 DIAGNOSIS — M810 Age-related osteoporosis without current pathological fracture: Secondary | ICD-10-CM | POA: Diagnosis not present

## 2016-03-01 DIAGNOSIS — E784 Other hyperlipidemia: Secondary | ICD-10-CM | POA: Diagnosis not present

## 2016-03-01 DIAGNOSIS — I6789 Other cerebrovascular disease: Secondary | ICD-10-CM | POA: Diagnosis not present

## 2016-03-01 DIAGNOSIS — Z23 Encounter for immunization: Secondary | ICD-10-CM | POA: Diagnosis not present

## 2016-03-01 DIAGNOSIS — Z0000 Encounter for general adult medical examination without abnormal findings: Secondary | ICD-10-CM | POA: Diagnosis not present

## 2016-03-01 DIAGNOSIS — Z1389 Encounter for screening for other disorder: Secondary | ICD-10-CM | POA: Diagnosis not present

## 2016-03-01 DIAGNOSIS — Z681 Body mass index (BMI) 19 or less, adult: Secondary | ICD-10-CM | POA: Diagnosis not present

## 2016-03-01 DIAGNOSIS — M810 Age-related osteoporosis without current pathological fracture: Secondary | ICD-10-CM | POA: Diagnosis not present

## 2016-03-02 DIAGNOSIS — Z1212 Encounter for screening for malignant neoplasm of rectum: Secondary | ICD-10-CM | POA: Diagnosis not present

## 2016-04-11 DIAGNOSIS — L820 Inflamed seborrheic keratosis: Secondary | ICD-10-CM | POA: Diagnosis not present

## 2016-05-01 ENCOUNTER — Other Ambulatory Visit: Payer: Self-pay | Admitting: Obstetrics and Gynecology

## 2016-05-01 ENCOUNTER — Other Ambulatory Visit: Payer: Self-pay | Admitting: Nurse Practitioner

## 2016-05-01 DIAGNOSIS — Z1231 Encounter for screening mammogram for malignant neoplasm of breast: Secondary | ICD-10-CM

## 2016-05-20 IMAGING — CR DG ANKLE COMPLETE 3+V*R*
3 series · 3 of 3 positions shown · non-contrast
Comparison: None.

CLINICAL DATA: Right ankle discomfort

EXAM:
RIGHT ANKLE - COMPLETE 3+ VIEW

[AP]
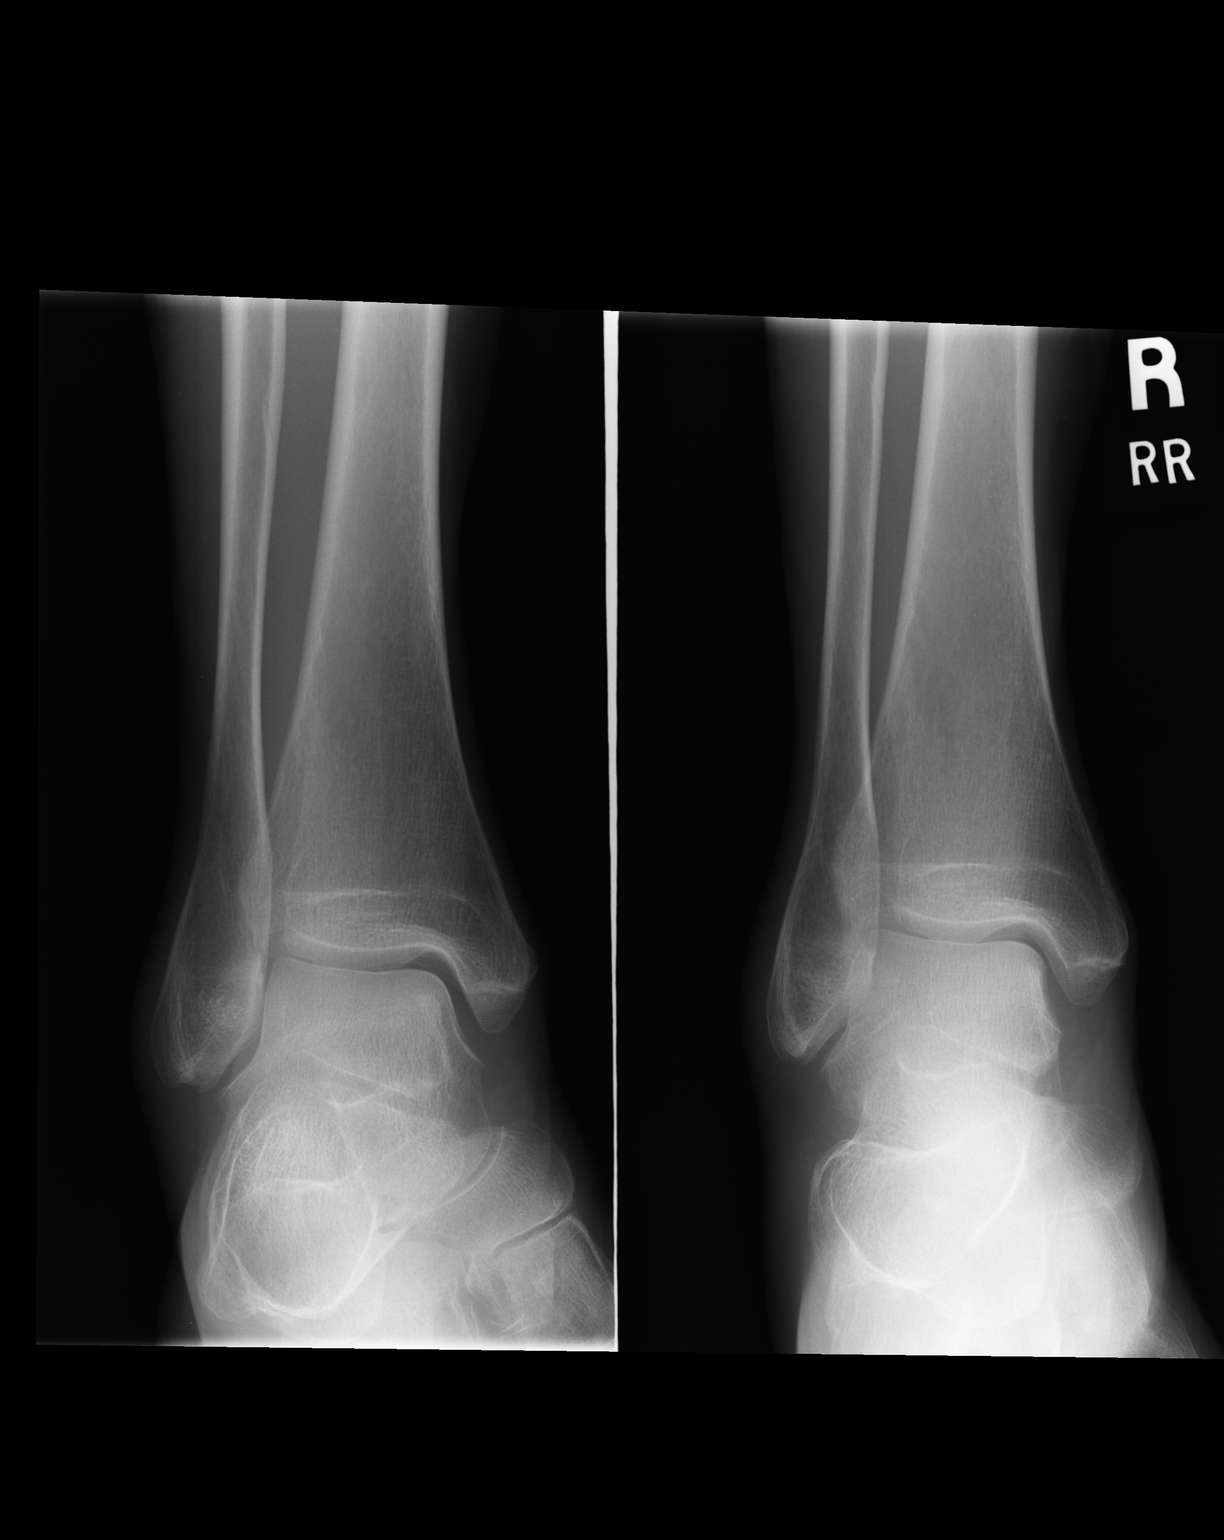

[ap obl int rot]
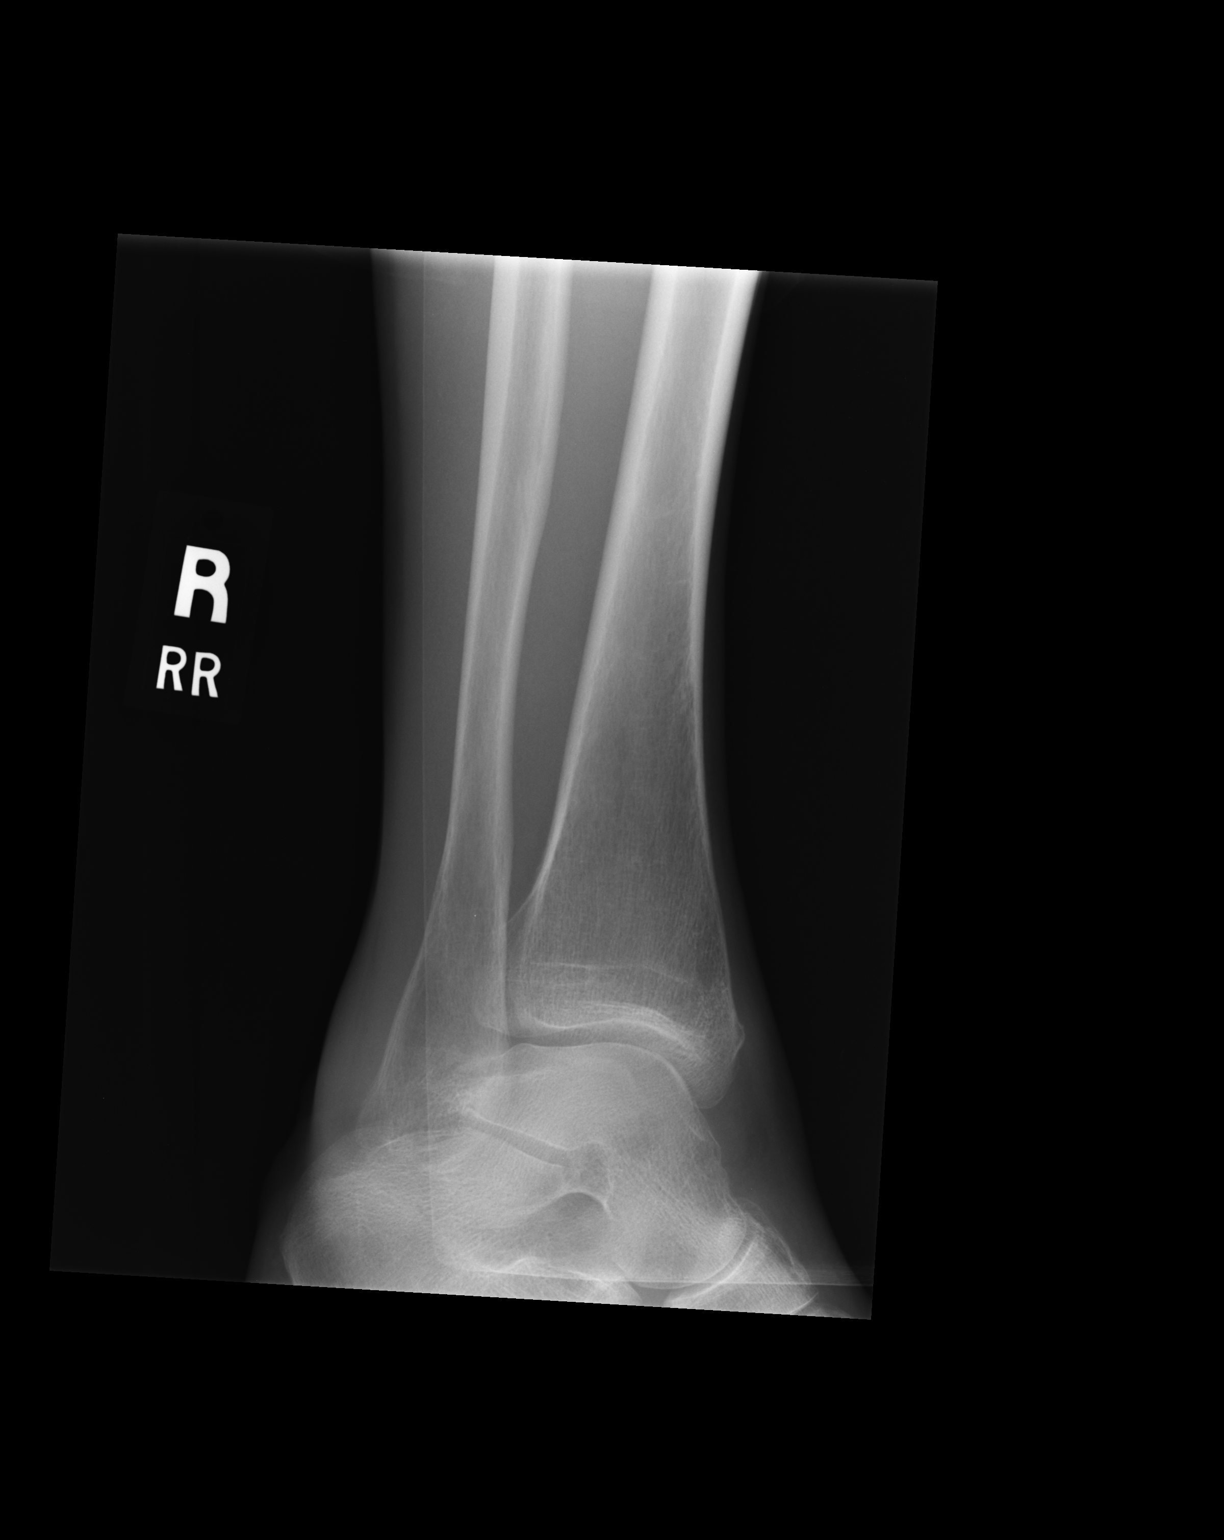

[lateral]
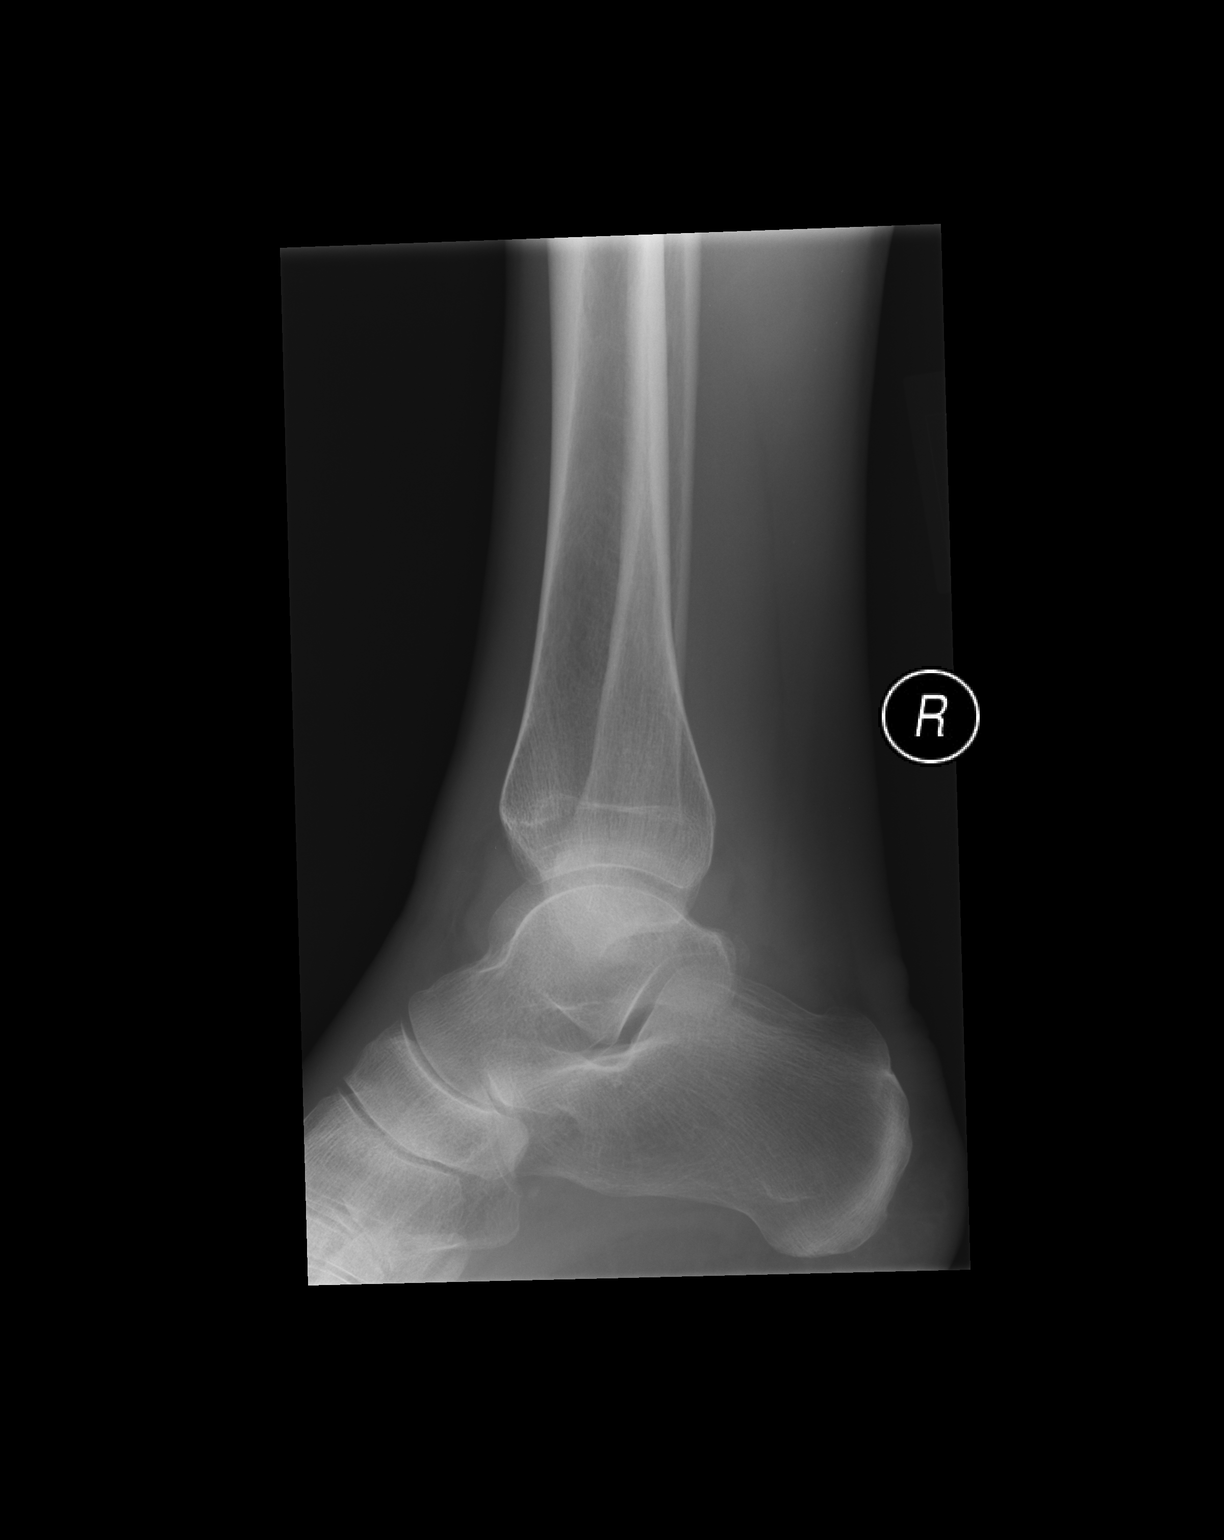

[3 of 3 positions shown; findings below may reference images not displayed]

FINDINGS: The bones are adequately mineralized. There is no acute fracture nor
dislocation. The ankle joint mortise is preserved. The talar dome is
intact. The overlying soft tissues are unremarkable.
IMPRESSION: There is no acute bony abnormality of the right ankle.

## 2016-06-11 ENCOUNTER — Ambulatory Visit
Admission: RE | Admit: 2016-06-11 | Discharge: 2016-06-11 | Disposition: A | Discharge status: Home / Self Care | Payer: PPO | Source: Ambulatory Visit | Attending: Nurse Practitioner | Admitting: Nurse Practitioner

## 2016-06-11 DIAGNOSIS — Z1231 Encounter for screening mammogram for malignant neoplasm of breast: Secondary | ICD-10-CM

## 2016-07-06 ENCOUNTER — Encounter (HOSPITAL_COMMUNITY): Payer: No Typology Code available for payment source

## 2016-07-06 ENCOUNTER — Ambulatory Visit: Payer: No Typology Code available for payment source | Admitting: Family

## 2016-08-11 DIAGNOSIS — Z23 Encounter for immunization: Secondary | ICD-10-CM | POA: Diagnosis not present

## 2016-08-27 ENCOUNTER — Encounter: Payer: Self-pay | Admitting: Family

## 2016-08-30 ENCOUNTER — Ambulatory Visit (HOSPITAL_COMMUNITY)
Admission: RE | Admit: 2016-08-30 | Discharge: 2016-08-30 | Disposition: A | Discharge status: Home / Self Care | Payer: PPO | Source: Ambulatory Visit | Attending: Family | Admitting: Family

## 2016-08-30 ENCOUNTER — Ambulatory Visit (INDEPENDENT_AMBULATORY_CARE_PROVIDER_SITE_OTHER): Payer: PPO | Admitting: Family

## 2016-08-30 ENCOUNTER — Encounter: Payer: Self-pay | Admitting: Family

## 2016-08-30 VITALS — BP 120/60 | HR 67 | Temp 97.8°F | Resp 16 | Ht 64.0 in | Wt 109.0 lb

## 2016-08-30 DIAGNOSIS — I773 Arterial fibromuscular dysplasia: Secondary | ICD-10-CM | POA: Diagnosis not present

## 2016-08-30 DIAGNOSIS — I6522 Occlusion and stenosis of left carotid artery: Secondary | ICD-10-CM | POA: Diagnosis not present

## 2016-08-30 LAB — VAS US CAROTID
LEFT ECA DIAS: -18 cm/s
LEFT VERTEBRAL DIAS: -13 cm/s
Left CCA dist dias: 24 cm/s
Left CCA dist sys: 76 cm/s
Left CCA prox dias: -14 cm/s
Left CCA prox sys: -96 cm/s
Left ICA dist dias: -19 cm/s
Left ICA dist sys: -63 cm/s
Left ICA prox dias: -31 cm/s
Left ICA prox sys: -105 cm/s
RIGHT CCA MID DIAS: -34 cm/s
RIGHT ECA DIAS: -28 cm/s
RIGHT VERTEBRAL DIAS: 23 cm/s
Right CCA prox dias: 31 cm/s
Right CCA prox sys: 115 cm/s
Right cca dist sys: -73 cm/s

## 2016-08-30 NOTE — Progress Notes (Signed)
Chief Complaint: Follow up Extracranial Carotid Artery Stenosis   History of Present Illness  Tiffany Nguyen is a 70 y.o. female patient of Dr. Scot Dock who has known carotid artery disease. At the time of her last visit, she had a 60-79% left carotid stenosis with no significant stenosis on the right. The velocities did not change significantly over a year. 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  She comes in for a one year follow up visit. She denies any history of stroke, TIAs, expressive or receptive aphasia, or amaurosis fugax. She is on aspirin and is on a statin.  Pt denies any problems with high blood pressure.  She takes B12 po daily.   Pt Diabetic: no Pt smoker: non-smoker  Pt meds include: Statin : yes ASA: yes Other anticoagulants/antiplatelets: no   Past Medical History:  Diagnosis Date  . Anemia 01/25/12   Slight  . Breast abscess 2005  . Carotid artery occlusion    right  . Gastric polyp 2011   hyperplastic  . Hyperlipidemia   . Lichen sclerosus et atrophicus of the vulva biopsy proven 08/11/2010  . Osteoporosis   . Thyroid disease 2007 ?   Parathyroid    Social History Social History  Substance Use Topics  . Smoking status: Never Smoker  . Smokeless tobacco: Never Used  . Alcohol use No    Family History Family History  Problem Relation Age of Onset  . COPD Father   . Hypertension Mother   . Pulmonary embolism Mother     Surgical History Past Surgical History:  Procedure Laterality Date  . APPENDECTOMY  01/08/2002  . KNEE SURGERY Left 1994  . PARATHYROIDECTOMY  06/2007  . TUBAL LIGATION  1983    Allergies  Allergen Reactions  . Hydrocodone-Ibuprofen Nausea And Vomiting    Current Outpatient Prescriptions  Medication Sig Dispense Refill  . alendronate (FOSAMAX) 70 MG tablet Take 1 tablet by mouth once a week.    Marland Kitchen aspirin 81 MG tablet Take 81 mg by mouth daily.    . Calcium  Carbonate-Vitamin D (CALTRATE 600+D PO) Take by mouth.    . cholecalciferol (VITAMIN D) 1000 UNITS tablet Take 1,000 Units by mouth daily.    . cyanocobalamin 500 MCG tablet Take 500 mcg by mouth daily. Vitamin B12.    . simvastatin (ZOCOR) 40 MG tablet Take 40 mg by mouth every evening.    . conjugated estrogens (PREMARIN) vaginal cream Use 1/2 g vaginally at bedtime three times weekly and pea size amount at the urethra (Patient not taking: Reported on 08/30/2016) 42.5 g 3  . nystatin cream (MYCOSTATIN) Apply 1 application topically 2 (two) times daily. Apply to affected area BID for up to 7 days. (Patient not taking: Reported on 08/30/2016) 30 g 1  . triamcinolone (KENALOG) 0.025 % ointment Apply 1 application topically 2 (two) times daily. (Patient not taking: Reported on 08/30/2016) 30 g 1   No current facility-administered medications for this visit.     Review of Systems : See HPI for pertinent positives and negatives.  Physical Examination  Vitals:   08/30/16 1542 08/30/16 1544  BP: 118/66 120/60  Pulse: 67   Resp: 16   Temp: 97.8 F (36.6 C)   TempSrc: Oral   SpO2: 99%   Weight: 109 lb (49.4 kg)   Height: 5\' 4"  (1.626 m)    Body mass index is 18.71 kg/m.  General: WDWN female in NAD GAIT: normal  Eyes: PERRLA Pulmonary: Respirations are non-labored, CTAB, good air movement in all fields, no rales, rhonchi, or wheezing.  Cardiac: regular rhythm and rate,  no detected murmur.  VASCULAR EXAM Carotid Bruits Right Left   Negative Positive    Aorta is not palpable. Radial pulses are 2+ palpable and equal.                                                                                                                                          LE Pulses Right Left       POPLITEAL  not palpable  not palpable       POSTERIOR TIBIAL  2+ palpable  2+ palpable       DORSALIS PEDIS+      ANTERIOR TIBIAL 2+ palpable 1+ palpable    Gastrointestinal: soft, nontender, BS  WNL, no r/g,  no palpable masses.  Musculoskeletal: no muscle atrophy/wasting. M/S 5/5 throughout, extremities without ischemic changes.  Neurologic: A&O X 3; Appropriate Affect, Speech is normal CN 2-12 intact, pain and light touch intact in extremities, Motor exam as listed above.    Assessment: Tiffany Nguyen is a 70 y.o. female who has no history of stroke or TIA. She has a left carotid bruit.  She has been followed for several years with stable extracranial carotid artery fibromuscular dysplasia.   Her atherosclerotic risk factors are minimal or not present: she does not have DM, has never used tobacco, has a low-normal BMI, stays physically active, takes a daily statin and ASA.   DATA Today's carotid Duplex suggests no evidence of stenosis in the bilateral carotid arteries. Elevated Doppler velocities and flow pattern characteristics of the left mid internal carotid artery appear to be consistent with fibromuscular dysplasia. Bilateral vertebral arteries are antegrade (normal).  Bilateral subclavian arteries are multiphasic (nromal).  No significant change noted when compared to the previous exam on 07/06/15.   Plan: Follow-up in 18 months with Carotid Duplex scan.   I discussed in depth with the patient the nature of atherosclerosis, and emphasized the importance of maximal medical management including strict control of blood pressure, blood glucose, and lipid levels, obtaining regular exercise, and continued cessation of smoking.  The patient is aware that without maximal medical management the underlying atherosclerotic disease process will progress, limiting the benefit of any interventions. The patient was given information about stroke prevention and what symptoms should prompt the patient to seek immediate medical care. Thank you for allowing [place] to participate in this patient's care.  Clemon Chambers, RN, MSN, FNP-C [facility] Office:  [phone]  Clinic Physician: Oneida Alar  08/30/16 3:57 PM

## 2016-09-25 NOTE — Addendum Note (Signed)
Addended by: Mena Goes on: 09/25/2016 04:11 PM   Modules accepted: Orders

## 2017-02-12 ENCOUNTER — Ambulatory Visit: Payer: PPO | Admitting: Nurse Practitioner

## 2017-02-18 ENCOUNTER — Ambulatory Visit (INDEPENDENT_AMBULATORY_CARE_PROVIDER_SITE_OTHER): Payer: PPO | Admitting: Nurse Practitioner

## 2017-02-18 ENCOUNTER — Encounter: Payer: Self-pay | Admitting: Nurse Practitioner

## 2017-02-18 VITALS — BP 120/66 | HR 72 | Ht 63.75 in | Wt 109.0 lb

## 2017-02-18 DIAGNOSIS — Z1159 Encounter for screening for other viral diseases: Secondary | ICD-10-CM

## 2017-02-18 DIAGNOSIS — Z0000 Encounter for general adult medical examination without abnormal findings: Secondary | ICD-10-CM

## 2017-02-18 DIAGNOSIS — M810 Age-related osteoporosis without current pathological fracture: Secondary | ICD-10-CM | POA: Diagnosis not present

## 2017-02-18 DIAGNOSIS — Z124 Encounter for screening for malignant neoplasm of cervix: Secondary | ICD-10-CM | POA: Diagnosis not present

## 2017-02-18 DIAGNOSIS — N952 Postmenopausal atrophic vaginitis: Secondary | ICD-10-CM

## 2017-02-18 DIAGNOSIS — Q525 Fusion of labia: Secondary | ICD-10-CM

## 2017-02-18 DIAGNOSIS — Z01411 Encounter for gynecological examination (general) (routine) with abnormal findings: Secondary | ICD-10-CM

## 2017-02-18 DIAGNOSIS — N904 Leukoplakia of vulva: Secondary | ICD-10-CM

## 2017-02-18 LAB — HEPATITIS C ANTIBODY: HCV Ab: NEGATIVE

## 2017-02-18 MED ORDER — TRIAMCINOLONE ACETONIDE 0.025 % EX OINT: 1.0000 "application " | TOPICAL_OINTMENT | Freq: Two times a day (BID) | CUTANEOUS | 1 refills | Status: DC

## 2017-02-18 NOTE — Progress Notes (Signed)
71 y.o. G78P1001 Married  Caucasian Fe here for annual exam. She continues to work.  Husband does part time work with brother in Sports coach.  Both are doing well.   Patient's last menstrual period was 11/12/1996 (approximate).          Sexually active: No.  The current method of family planning is post menopausal status.    Exercising: Yes.  walking, but not as often as she would like Smoker:  no  Health Maintenance: Pap: 01/20/14 Negative, no history of abnormal  03/09/10, Negative MMG: 06/11/16, Bi-Rads 1: Negative Colonoscopy:  05/25/14, Tubular Adenoma x 2, repeat in 5 years, Dr. Fuller Plan BMD:  08/02/15 T Score, -1.6 Spine / -3.1 Right Femur Neck / -3.0 Left Femur Neck back on Fosamax 2/ 2017.  TDaP: 2013 Shingles: 04/03/07 Pneumonia: 10/23/11, Pneumovax, Prevnar-13, 2017 Hep C: done today Labs: PCP takes care of all labs   reports that she has never smoked. She has never used smokeless tobacco. She reports that she does not drink alcohol or use drugs.  Past Medical History:  Diagnosis Date  . Anemia 01/25/12   Slight  . Breast abscess 2005  . Carotid artery occlusion    right  . Gastric polyp 2011   hyperplastic  . Hyperlipidemia   . Lichen sclerosus et atrophicus of the vulva biopsy proven 08/11/2010  . Osteoporosis   . 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 APPENDECTOMY  01/08/2002  . KNEE SURGERY Left 1994  . PARATHYROIDECTOMY  06/2007  . TUBAL LIGATION  1983    Current Outpatient Prescriptions  Medication Sig Dispense Refill  . alendronate (FOSAMAX) 70 MG tablet Take 1 tablet by mouth once a week.    Marland Kitchen aspirin 81 MG tablet Take 81 mg by mouth daily.    . Calcium Carbonate-Vitamin D (CALTRATE 600+D PO) Take by mouth.    . cholecalciferol (VITAMIN D) 1000 UNITS tablet Take 1,000 Units by mouth daily.    Marland Kitchen conjugated estrogens (PREMARIN) vaginal cream Use 1/2 g vaginally at bedtime three times weekly and pea size amount at the  urethra 42.5 g 3  . cyanocobalamin 500 MCG tablet Take 500 mcg by mouth daily. Vitamin B12.    . nystatin cream (MYCOSTATIN) Apply 1 application topically 2 (two) times daily. Apply to affected area BID for up to 7 days. 30 g 1  . simvastatin (ZOCOR) 40 MG tablet Take 40 mg by mouth every evening.    . triamcinolone (KENALOG) 0.025 % ointment Apply 1 application topically 2 (two) times daily. 30 g 1   No current facility-administered medications for this visit.     Family History  Problem Relation Age of Onset  . COPD Father   . Hypertension Mother   . Pulmonary embolism Mother   . COPD Brother     smoker    ROS:  Pertinent items are noted in HPI.  Otherwise, a comprehensive ROS was negative.  Exam:   BP 120/66 (BP Location: Right Arm, Patient Position: Sitting, Cuff Size: Normal)   Pulse 72   Ht 5' 3.75" (1.619 m)   Wt 109 lb (49.4 kg)   LMP 11/12/1996 (Approximate)   BMI 18.86 kg/m  Height: 5' 3.75" (161.9 cm) Ht Readings from Last 3 Encounters:  02/18/17 5' 3.75" (1.619 m)  08/30/16 5\' 4"  (1.626 m)  02/01/16 5\' 4"  (1.626 m)    General appearance: alert, cooperative and appears stated age Head:  Normocephalic, without obvious abnormality, atraumatic Neck: no adenopathy, supple, symmetrical, trachea midline and thyroid normal to inspection and palpation Lungs: clear to auscultation bilaterally Breasts: normal appearance, no masses or tenderness Heart: regular rate and rhythm Abdomen: soft, non-tender; no masses,  no organomegaly Extremities: extremities normal, atraumatic, no cyanosis or edema Skin: Skin color, texture, turgor normal. No rashes or lesions Lymph nodes: Cervical, supraclavicular, and axillary nodes normal. No abnormal inguinal nodes palpated Neurologic: Grossly normal   Pelvic: External genitalia:   Lesions of flare of LSA, labial fusion and atrophy              Urethra:  normal appearing urethra with no masses, tenderness or lesions               Bartholin's and Skene's: normal                 Vagina: normal appearing vagina with normal color and discharge, no lesions              Cervix: anteverted              Pap taken: Yes.  but limited due to atrophy Bimanual Exam:  Uterus:  normal size, contour, position, consistency, mobility, non-tender              Adnexa: no mass, fullness, tenderness               Rectovaginal: Confirms               Anus:  normal sphincter tone, no lesions  Chaperone present: yes  A:  Well Woman with normal exam  Postmenopausal HRT X 5 years 1998- 2003 Atrophic vaginitis with labial fusion - declines use of vaginal estrogen except for only prn, using OTC lubrication at the vulva             History of LSA biopsy proven 08/11/2010 with current flare Dyspareunia  Osteoporosis back on Fosamax - since 12/2015- had been off for about 7-8 yrs.  Will get BMD this fall   P:   Reviewed health and wellness pertinent to exam  Pap smear was done  Mammogram is due 05/2017  Refill on Temovate to use prn flare -will use now  Does not need a refill on Premarin to the urethra - rare use and not flared at present  Follow with lab  Counseled on breast self exam, mammography screening, adequate intake of calcium and vitamin D, diet and exercise, Kegel's exercises return annually or prn  An After Visit Summary was printed and given to the patient.

## 2017-02-18 NOTE — Patient Instructions (Signed)
EXERCISE AND DIET:  We recommended that you start or continue a regular exercise program for good health. Regular exercise means any activity that makes your heart beat faster and makes you sweat.  We recommend exercising at least 30 minutes per day at least 3 days a week, preferably 4 or 5.  We also recommend a diet low in fat and sugar.  Inactivity, poor dietary choices and obesity can cause diabetes, heart attack, stroke, and kidney damage, among others.    ALCOHOL AND SMOKING:  Women should limit their alcohol intake to no more than 7 drinks/beers/glasses of wine (combined, not each!) per week. Moderation of alcohol intake to this level decreases your risk of breast cancer and liver damage. And of course, no recreational drugs are part of a healthy lifestyle.  And absolutely no smoking or even second hand smoke. Most people know smoking can cause heart and lung diseases, but did you know it also contributes to weakening of your bones? Aging of your skin?  Yellowing of your teeth and nails?  CALCIUM AND VITAMIN D:  Adequate intake of calcium and Vitamin D are recommended.  The recommendations for exact amounts of these supplements seem to change often, but generally speaking 600 mg of calcium (either carbonate or citrate) and 800 units of Vitamin D per day seems prudent. Certain women may benefit from higher intake of Vitamin D.  If you are among these women, your doctor will have told you during your visit.    PAP SMEARS:  Pap smears, to check for cervical cancer or precancers,  have traditionally been done yearly, although recent scientific advances have shown that most women can have pap smears less often.  However, every woman still should have a physical exam from her gynecologist every year. It will include a breast check, inspection of the vulva and vagina to check for abnormal growths or skin changes, a visual exam of the cervix, and then an exam to evaluate the size and shape of the uterus and  ovaries.  And after 71 years of age, a rectal exam is indicated to check for rectal cancers. We will also provide age appropriate advice regarding health maintenance, like when you should have certain vaccines, screening for sexually transmitted diseases, bone density testing, colonoscopy, mammograms, etc.   MAMMOGRAMS:  All women over 40 years old should have a yearly mammogram. Many facilities now offer a "3D" mammogram, which may cost around $50 extra out of pocket. If possible,  we recommend you accept the option to have the 3D mammogram performed.  It both reduces the number of women who will be called back for extra views which then turn out to be normal, and it is better than the routine mammogram at detecting truly abnormal areas.    COLONOSCOPY:  Colonoscopy to screen for colon cancer is recommended for all women at age 50.  We know, you hate the idea of the prep.  We agree, BUT, having colon cancer and not knowing it is worse!!  Colon cancer so often starts as a polyp that can be seen and removed at colonscopy, which can quite literally save your life!  And if your first colonoscopy is normal and you have no family history of colon cancer, most women don't have to have it again for 10 years.  Once every ten years, you can do something that may end up saving your life, right?  We will be happy to help you get it scheduled when you are ready.    Be sure to check your insurance coverage so you understand how much it will cost.  It may be covered as a preventative service at no cost, but you should check your particular policy.     Lichen Sclerosus Lichen sclerosus is a skin problem. It can happen on any part of the body. It happens most often in the anal or genital areas. It can cause itching and discomfort. Treatment can help to control symptoms. This skin problem is not passed from one person to another (not contagious). The cause is not known. Follow these instructions at home:  Take  over-the-counter and prescription medicines only as told by your doctor.  Use creams or ointments as told by your doctor.  Do not scratch the affected areas of skin.  Women should keep the vagina as clean and dry as they can.  Keep all follow-up visits as told by your doctor. This is important. Contact a doctor if:  Your redness, swelling, or pain gets worse.  You have fluid, blood, or pus coming from the area.  You have new patches (lesions) on your skin.  You have a fever.  You have pain during sex. This information is not intended to replace advice given to you by your health care provider. Make sure you discuss any questions you have with your health care provider. Document Released: 10/11/2008 Document Revised: 04/05/2016 Document Reviewed: 01/24/2015 Elsevier Interactive Patient Education  2017 [place].   Temovate cream is used for a flare twice a day for about 5-7 days.  Then can use only if needed.  Get a mirror and make sure all redness is gone before stopping the med's.

## 2017-02-19 LAB — IPS PAP SMEAR ONLY

## 2017-02-20 ENCOUNTER — Telehealth: Payer: Self-pay | Admitting: Nurse Practitioner

## 2017-02-20 NOTE — Telephone Encounter (Signed)
Spoke with patient. Patient states that she was reviewing her AVS and noticed that it is noted she needs to use Temovate for flare. Patient states that she has Triamcinolone and thought Kem Boroughs, FNP said she could use this. Patient is asking for calcification if she can use Triamcinolone that she has or if she needs new rx for Temovate.

## 2017-02-20 NOTE — Telephone Encounter (Signed)
Patient has some questions about the medication that she was prescribed earlier this week.

## 2017-02-20 NOTE — Telephone Encounter (Signed)
She may use Triamcinolone BID for flare -which she needs now.  If she does not get good response then can change to Temovate.

## 2017-02-21 NOTE — Progress Notes (Signed)
Encounter reviewed by Dr. Brook Amundson C. Silva.  

## 2017-02-21 NOTE — Telephone Encounter (Signed)
Spoke with patient. Advised of message as seen below from Patricia Grubb, FNP. Patient is agreeable and verbalizes understanding.  Routing to provider for final review. Patient agreeable to disposition. Will close encounter.  

## 2017-03-01 DIAGNOSIS — M810 Age-related osteoporosis without current pathological fracture: Secondary | ICD-10-CM | POA: Diagnosis not present

## 2017-03-01 DIAGNOSIS — E784 Other hyperlipidemia: Secondary | ICD-10-CM | POA: Diagnosis not present

## 2017-03-08 DIAGNOSIS — Z0000 Encounter for general adult medical examination without abnormal findings: Secondary | ICD-10-CM | POA: Diagnosis not present

## 2017-03-08 DIAGNOSIS — I6789 Other cerebrovascular disease: Secondary | ICD-10-CM | POA: Diagnosis not present

## 2017-03-08 DIAGNOSIS — Z681 Body mass index (BMI) 19 or less, adult: Secondary | ICD-10-CM | POA: Diagnosis not present

## 2017-03-08 DIAGNOSIS — D6489 Other specified anemias: Secondary | ICD-10-CM | POA: Diagnosis not present

## 2017-03-08 DIAGNOSIS — M810 Age-related osteoporosis without current pathological fracture: Secondary | ICD-10-CM | POA: Diagnosis not present

## 2017-03-08 DIAGNOSIS — E784 Other hyperlipidemia: Secondary | ICD-10-CM | POA: Diagnosis not present

## 2017-03-08 DIAGNOSIS — Z1389 Encounter for screening for other disorder: Secondary | ICD-10-CM | POA: Diagnosis not present

## 2017-03-15 DIAGNOSIS — Z1212 Encounter for screening for malignant neoplasm of rectum: Secondary | ICD-10-CM | POA: Diagnosis not present

## 2017-04-18 ENCOUNTER — Other Ambulatory Visit: Payer: Self-pay | Admitting: Nurse Practitioner

## 2017-04-18 DIAGNOSIS — Z1231 Encounter for screening mammogram for malignant neoplasm of breast: Secondary | ICD-10-CM

## 2017-06-07 ENCOUNTER — Telehealth: Payer: Self-pay | Admitting: Obstetrics and Gynecology

## 2017-06-07 NOTE — Telephone Encounter (Signed)
Left message for patient to reschedule Patty Grubb appt. °

## 2017-06-12 ENCOUNTER — Ambulatory Visit: Payer: PPO

## 2017-06-14 ENCOUNTER — Ambulatory Visit
Admission: RE | Admit: 2017-06-14 | Discharge: 2017-06-14 | Disposition: A | Discharge status: Home / Self Care | Payer: PPO | Source: Ambulatory Visit | Attending: Nurse Practitioner | Admitting: Nurse Practitioner

## 2017-06-14 DIAGNOSIS — Z1231 Encounter for screening mammogram for malignant neoplasm of breast: Secondary | ICD-10-CM | POA: Diagnosis not present

## 2017-07-30 DIAGNOSIS — Z23 Encounter for immunization: Secondary | ICD-10-CM | POA: Diagnosis not present

## 2017-08-14 DIAGNOSIS — M810 Age-related osteoporosis without current pathological fracture: Secondary | ICD-10-CM | POA: Diagnosis not present

## 2017-11-07 DIAGNOSIS — J019 Acute sinusitis, unspecified: Secondary | ICD-10-CM | POA: Diagnosis not present

## 2017-11-07 DIAGNOSIS — Z681 Body mass index (BMI) 19 or less, adult: Secondary | ICD-10-CM | POA: Diagnosis not present

## 2017-11-07 DIAGNOSIS — R05 Cough: Secondary | ICD-10-CM | POA: Diagnosis not present

## 2017-11-07 DIAGNOSIS — R110 Nausea: Secondary | ICD-10-CM | POA: Diagnosis not present

## 2017-11-18 DIAGNOSIS — H6093 Unspecified otitis externa, bilateral: Secondary | ICD-10-CM | POA: Diagnosis not present

## 2017-11-18 DIAGNOSIS — H6981 Other specified disorders of Eustachian tube, right ear: Secondary | ICD-10-CM | POA: Diagnosis not present

## 2017-11-18 DIAGNOSIS — Z681 Body mass index (BMI) 19 or less, adult: Secondary | ICD-10-CM | POA: Diagnosis not present

## 2017-12-03 DIAGNOSIS — H6983 Other specified disorders of Eustachian tube, bilateral: Secondary | ICD-10-CM | POA: Diagnosis not present

## 2017-12-03 DIAGNOSIS — H6502 Acute serous otitis media, left ear: Secondary | ICD-10-CM | POA: Diagnosis not present

## 2018-02-21 ENCOUNTER — Ambulatory Visit: Payer: PPO | Admitting: Nurse Practitioner

## 2018-02-24 ENCOUNTER — Encounter: Payer: Self-pay | Admitting: Obstetrics & Gynecology

## 2018-02-24 ENCOUNTER — Other Ambulatory Visit: Payer: Self-pay

## 2018-02-24 ENCOUNTER — Ambulatory Visit (INDEPENDENT_AMBULATORY_CARE_PROVIDER_SITE_OTHER): Payer: PPO | Admitting: Obstetrics & Gynecology

## 2018-02-24 VITALS — BP 114/68 | HR 76 | Resp 16 | Ht 64.0 in | Wt 106.6 lb

## 2018-02-24 DIAGNOSIS — Z01411 Encounter for gynecological examination (general) (routine) with abnormal findings: Secondary | ICD-10-CM

## 2018-02-24 MED ORDER — TRIAMCINOLONE ACETONIDE 0.025 % EX OINT: 1.0000 "application " | TOPICAL_OINTMENT | Freq: Two times a day (BID) | CUTANEOUS | 1 refills | Status: DC

## 2018-02-24 NOTE — Progress Notes (Signed)
72 y.o. G1P1001 MarriedCaucasianF here for annual exam.  Former patient of [facility].  She is [facility]'s niece.  Aunt fell and had traumatic brain image.  She is now at a long term care facility in [place].  [name]'s daughter lives in [place].    Denies vaginal bleeding.   Works at [employer]    PCP:  [facility].  Will be seen again this fall.    Patient's last menstrual period was 11/12/1996 (approximate).          Sexually active: No  The current method of family planning is post menopausal status.    Exercising: Yes.    walking Smoker:  no  Health Maintenance: Pap:  02/18/17 Neg   01/20/14 Neg  History of abnormal Pap:  no MMG:  06/17/17 BIRADS1:Neg, doing 3D Colonoscopy:  05/25/14 Polyps. F/u 5 years  BMD:   11/10/15 Osteoporosis.  On Fosamax since 2/17.  Followed by [facility].  Will have repeat testing per pt later this year. TDaP:  2013 Pneumonia vaccine(s):  2012 Shingrix: completed 2019 Hep C testing: 02/18/17 Neg  Screening Labs: PCP   reports that she has never smoked. She has never used smokeless tobacco. She reports that she does not drink alcohol or use drugs.  Past Medical History:  Diagnosis Date  . Anemia 01/25/12   Slight  . Breast abscess 2005  . Carotid artery occlusion    right  . Gastric polyp 2011   hyperplastic  . Hyperlipidemia   . Lichen sclerosus et atrophicus of the vulva biopsy proven 08/11/2010  . Osteoporosis   . 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 APPENDECTOMY  01/08/2002  . KNEE SURGERY Left 1994  . PARATHYROIDECTOMY  06/2007  . TUBAL LIGATION  1983    Current Outpatient Medications  Medication Sig Dispense Refill  . alendronate (FOSAMAX) 70 MG tablet Take 1 tablet by mouth once a week.    Marland Kitchen aspirin 81 MG tablet Take 81 mg by mouth daily.    . Calcium Carbonate-Vitamin D (CALTRATE 600+D PO) Take by mouth.    . cholecalciferol  (VITAMIN D) 1000 UNITS tablet Take 1,000 Units by mouth daily.    . cyanocobalamin 500 MCG tablet Take 500 mcg by mouth daily. Vitamin B12.    . simvastatin (ZOCOR) 40 MG tablet Take 40 mg by mouth every evening.    . triamcinolone (KENALOG) 0.025 % ointment Apply 1 application topically 2 (two) times daily. Do not use for more than 7 days. 30 g 1   No current facility-administered medications for this visit.     Family History  Problem Relation Age of Onset  . COPD Father   . Hypertension Mother   . Pulmonary embolism Mother   . COPD Brother        smoker    Review of Systems  All other systems reviewed and are negative.   Exam:   BP 114/68 (BP Location: Right Arm, Patient Position: Sitting, Cuff Size: Normal)   Pulse 76   Resp 16   Ht 5\' 4"  (1.626 m)   Wt 106 lb 9.6 oz (48.4 kg)   LMP 11/12/1996 (Approximate)   BMI 18.30 kg/m    Height: 5\' 4"  (162.6 cm)  Ht Readings from Last 3 Encounters:  02/24/18 5\' 4"  (1.626 m)  02/18/17 5' 3.75" (1.619 m)  08/30/16 5\' 4"  (1.626 m)  General appearance: alert, cooperative and appears stated age Head: Normocephalic, without obvious abnormality, atraumatic Neck: no adenopathy, supple, symmetrical, trachea midline and thyroid normal to inspection and palpation Lungs: clear to auscultation bilaterally Breasts: normal appearance, no masses or tenderness Heart: regular rate and rhythm Abdomen: soft, non-tender; bowel sounds normal; no masses,  no organomegaly Extremities: extremities normal, atraumatic, no cyanosis or edema Skin: Skin color, texture, turgor normal. No rashes or lesions Lymph nodes: Cervical, supraclavicular, and axillary nodes normal. No abnormal inguinal nodes palpated Neurologic: Grossly normal  Pelvic: External genitalia:  Hypopigmentation within labia minor c/w lichen sclerosus, no excoriations, also hypopigmentation of skin of groin on right side c/w vitiligo              Urethra:  normal appearing urethra with  no masses, tenderness or lesions              Bartholins and Skenes: normal                 Vagina: normal appearing vagina with normal color and discharge, no lesions              Cervix: no lesions              Pap taken: No. Bimanual Exam:  Uterus:  normal size, contour, position, consistency, mobility, non-tender              Adnexa: normal adnexa and no mass, fullness, tenderness               Rectovaginal: Confirms               Anus:  normal sphincter tone, no lesions  Chaperone was present for exam.  A:  Well Woman with normal exam PMP, no HRT (stopped in 2003) Carotid stenosis Vulvar vitiligo Likely lichen sclerosus, biopsy proven 2011 ( in Epic) Osteoporosis  P:   Mammogram guidelines reviewed.  Doing 3D. pap smear not indicated Lab work done with [facility]. BMD will be done later this year RF for triamcinolone 0.25% ointment bid for up to 7 days with flare.  Pt knows to call with any bleeding, itching that persists for >7 days Due to carotid stenosis, have recommended no estrogen use.  Pt comfortable with this. return annually or prn

## 2018-03-06 ENCOUNTER — Ambulatory Visit: Payer: PPO | Admitting: Family

## 2018-03-06 ENCOUNTER — Ambulatory Visit (HOSPITAL_COMMUNITY)
Admission: RE | Admit: 2018-03-06 | Discharge: 2018-03-06 | Disposition: A | Discharge status: Home / Self Care | Payer: PPO | Source: Ambulatory Visit | Attending: Family | Admitting: Family

## 2018-03-06 ENCOUNTER — Encounter: Payer: Self-pay | Admitting: Family

## 2018-03-06 VITALS — BP 103/60 | HR 68 | Temp 97.9°F | Resp 18 | Ht 64.0 in | Wt 105.8 lb

## 2018-03-06 DIAGNOSIS — E7849 Other hyperlipidemia: Secondary | ICD-10-CM | POA: Diagnosis not present

## 2018-03-06 DIAGNOSIS — R82998 Other abnormal findings in urine: Secondary | ICD-10-CM | POA: Diagnosis not present

## 2018-03-06 DIAGNOSIS — M810 Age-related osteoporosis without current pathological fracture: Secondary | ICD-10-CM | POA: Diagnosis not present

## 2018-03-06 DIAGNOSIS — I773 Arterial fibromuscular dysplasia: Secondary | ICD-10-CM | POA: Insufficient documentation

## 2018-03-06 NOTE — Patient Instructions (Signed)

## 2018-03-06 NOTE — Progress Notes (Signed)
Chief Complaint: Follow up Extracranial Carotid Artery Stenosis   History of Present Illness  [facility] is a 72 y.o. female patient of Dr. Scot Dock who has known carotid artery disease. At her 2013 visit she had a 60-79% left carotid stenosis with no significant stenosis on the right. The velocities did not change significantly over a year. 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  She denies any history of stroke, TIAs, expressive or receptive aphasia, or amaurosis fugax. She is on aspirin and is on a statin.  She has no claudication symptoms with walking.  Pt denies any problems with high blood pressure.  She takes B12 po daily.   Pt Diabetic: no Pt smoker: non-smoker  Pt meds include: Statin : yes ASA: yes Other anticoagulants/antiplatelets: no   Past Medical History:  Diagnosis Date  . Anemia 01/25/12   Slight  . Breast abscess 2005  . Carotid artery occlusion    right  . Gastric polyp 2011   hyperplastic  . Hyperlipidemia   . Lichen sclerosus et atrophicus of the vulva biopsy proven 08/11/2010  . Osteoporosis   . 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 Smoking status: Never Smoker  . Smokeless tobacco: Never Used  Substance Use Topics  . Alcohol use: No    Alcohol/week: 0.0 oz  . Drug use: No    Family History Family History  Problem Relation Age of Onset  . COPD Father   . Hypertension Mother   . Pulmonary embolism Mother   . COPD Brother        smoker    Surgical History Past Surgical History:  Procedure Laterality Date  . APPENDECTOMY  01/08/2002  . KNEE SURGERY Left 1994  . PARATHYROIDECTOMY  06/2007  . TUBAL LIGATION  1983    Allergies  Allergen Reactions  . Hydrocodone-Ibuprofen Nausea And Vomiting    Current Outpatient Medications  Medication Sig Dispense Refill  . alendronate (FOSAMAX) 70 MG tablet Take 1 tablet by mouth once a week.     Marland Kitchen aspirin 81 MG tablet Take 81 mg by mouth daily.    . Calcium Carbonate-Vitamin D (CALTRATE 600+D PO) Take by mouth.    . cholecalciferol (VITAMIN D) 1000 UNITS tablet Take 1,000 Units by mouth daily.    . cyanocobalamin 500 MCG tablet Take 500 mcg by mouth daily. Vitamin B12.    . simvastatin (ZOCOR) 40 MG tablet Take 40 mg by mouth every evening.    . triamcinolone (KENALOG) 0.025 % ointment Apply 1 application topically 2 (two) times daily. Do not use for more than 7 days. 30 g 1   No current facility-administered medications for this visit.     Review of Systems : See HPI for pertinent positives and negatives.  Physical Examination  Vitals:   03/06/18 1545 03/06/18 1548  BP: (!) 106/55 103/60  Pulse: 61 68  Resp: 18   Temp: 97.9 F (36.6 C)   TempSrc: Oral   SpO2: 98% 99%  Weight: 105 lb 12.8 oz (48 kg)   Height: 5\' 4"  (1.626 m)    Body mass index is 18.16 kg/m.  General: WDWN, trim, fit appearing female in NAD GAIT: normal Eyes: PERRLA HENT: No gross abnormalities.  Pulmonary:  Respirations are non-labored, good air movement, CTAB, no rales, rhonchi, or wheezing. Cardiac: regular rhythm, no detected murmur.  VASCULAR EXAM Carotid Bruits Right Left  Negative Positive     Abdominal aortic pulse is not palpable. Radial pulses are 2+ palpable and equal.                                                                                                                            LE Pulses Right Left       POPLITEAL  2+ palpable   not palpable       POSTERIOR TIBIAL  2+ palpable   2+ palpable        DORSALIS PEDIS      ANTERIOR TIBIAL 1+ palpable  1+ palpable     Gastrointestinal: soft, nontender, BS WNL, no r/g, no palpable masses. Musculoskeletal: No muscle atrophy/wasting. M/S 5/5 throughout extremities without ischemic changes. Skin: No rashes, no ulcers, no cellulitis.   Neurologic:  A&O X 3; appropriate affect, sensation is normal; speech is normal, CN  2-12 intact, pain and light touch intact in extremities, motor exam as listed above. Psychiatric: Normal thought content, mood appropriate to clinical situation.    Assessment: [facility] is a 72 y.o. female whowho has no history of stroke or TIA. She has a left carotid bruit.  She has been followed for several years with stable extracranial carotid artery fibromuscular dysplasia.   Her atherosclerotic risk factors are minimal or not present: she does not have DM, has never used tobacco, has a low-normal BMI, stays physically active, takes a daily statin and ASA.   DATA Carotid Duplex (03/06/18): Nno evidence of stenosis in the bilateral carotid arteries.Elevated Doppler velocities and flow pattern characteristics of the left mid internal carotid artery appear to be consistent with fibromuscular dysplasia. Bilateral vertebral arteries are antegrade (normal).  Bilateral subclavian arteries are multiphasic (nromal).  No significant change noted when compared to the previous exams on 07-01-12, 02-25-13, 06-30-14, 07/06/15, and 08-30-16.    Plan: Follow-up in 1 year with Carotid Duplex scan.     I discussed in depth with the patient the nature of atherosclerosis, and emphasized the importance of maximal medical management including strict control of blood pressure, blood glucose, and lipid levels, obtaining regular exercise, and continued cessation of smoking.  The patient is aware that without maximal medical management the underlying atherosclerotic disease process will progress, limiting the benefit of any interventions. The patient was given information about stroke prevention and what symptoms should prompt the patient to seek immediate medical care. Thank you for allowing [place] to participate in this patient's care.  Clemon Chambers, RN, MSN, FNP-C [facility] Office: [phone]  Clinic Physician: Oneida Alar  03/06/18 3:51 PM

## 2018-03-14 DIAGNOSIS — Z0000 Encounter for general adult medical examination without abnormal findings: Secondary | ICD-10-CM | POA: Diagnosis not present

## 2018-03-14 DIAGNOSIS — I6789 Other cerebrovascular disease: Secondary | ICD-10-CM | POA: Diagnosis not present

## 2018-03-14 DIAGNOSIS — Z1389 Encounter for screening for other disorder: Secondary | ICD-10-CM | POA: Diagnosis not present

## 2018-03-14 DIAGNOSIS — E7849 Other hyperlipidemia: Secondary | ICD-10-CM | POA: Diagnosis not present

## 2018-03-14 DIAGNOSIS — Z681 Body mass index (BMI) 19 or less, adult: Secondary | ICD-10-CM | POA: Diagnosis not present

## 2018-03-14 DIAGNOSIS — M810 Age-related osteoporosis without current pathological fracture: Secondary | ICD-10-CM | POA: Diagnosis not present

## 2018-03-14 DIAGNOSIS — Z1212 Encounter for screening for malignant neoplasm of rectum: Secondary | ICD-10-CM | POA: Diagnosis not present

## 2018-05-16 ENCOUNTER — Other Ambulatory Visit: Payer: Self-pay | Admitting: Obstetrics & Gynecology

## 2018-05-16 DIAGNOSIS — Z1231 Encounter for screening mammogram for malignant neoplasm of breast: Secondary | ICD-10-CM

## 2018-06-17 ENCOUNTER — Ambulatory Visit
Admission: RE | Admit: 2018-06-17 | Discharge: 2018-06-17 | Disposition: A | Discharge status: Home / Self Care | Payer: PPO | Source: Ambulatory Visit | Attending: Obstetrics & Gynecology | Admitting: Obstetrics & Gynecology

## 2018-06-17 DIAGNOSIS — Z1231 Encounter for screening mammogram for malignant neoplasm of breast: Secondary | ICD-10-CM | POA: Diagnosis not present

## 2018-07-19 DIAGNOSIS — Z23 Encounter for immunization: Secondary | ICD-10-CM | POA: Diagnosis not present

## 2019-05-12 ENCOUNTER — Other Ambulatory Visit: Payer: Self-pay

## 2019-05-12 ENCOUNTER — Telehealth: Payer: Self-pay | Admitting: Obstetrics & Gynecology

## 2019-05-12 NOTE — Telephone Encounter (Signed)
Upon completing covid prescreen, patient states a woman that came into her office is awaiting test results for covid. Patient was in contact with her one week ago.

## 2019-05-12 NOTE — Telephone Encounter (Signed)
Spoke with patient. 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 Patient denies any symptoms. Patient is scheduled for AEX on 7/2. Denies any GYN concerns. Provided patient option to r/s AEX to later date, AEX r/s to 06/05/2019 at 11am with Dr. Sabra Heck.   Routing to provider for final review. Patient is agreeable to disposition. Will close encounter.

## 2019-05-14 ENCOUNTER — Encounter

## 2019-05-14 ENCOUNTER — Ambulatory Visit: Payer: PPO | Admitting: Obstetrics & Gynecology

## 2019-06-02 ENCOUNTER — Other Ambulatory Visit: Payer: Self-pay

## 2019-06-03 ENCOUNTER — Encounter: Payer: Self-pay | Admitting: Gastroenterology

## 2019-06-05 ENCOUNTER — Other Ambulatory Visit: Payer: Self-pay

## 2019-06-05 ENCOUNTER — Ambulatory Visit (INDEPENDENT_AMBULATORY_CARE_PROVIDER_SITE_OTHER): Payer: PPO | Admitting: Obstetrics & Gynecology

## 2019-06-05 ENCOUNTER — Other Ambulatory Visit (HOSPITAL_COMMUNITY)
Admission: RE | Admit: 2019-06-05 | Discharge: 2019-06-05 | Disposition: A | Discharge status: Home / Self Care | Payer: PPO | Source: Ambulatory Visit | Attending: Obstetrics & Gynecology | Admitting: Obstetrics & Gynecology

## 2019-06-05 ENCOUNTER — Encounter: Payer: Self-pay | Admitting: Obstetrics & Gynecology

## 2019-06-05 VITALS — BP 126/70 | HR 84 | Temp 97.6°F | Ht 63.75 in | Wt 106.2 lb

## 2019-06-05 DIAGNOSIS — Z124 Encounter for screening for malignant neoplasm of cervix: Secondary | ICD-10-CM | POA: Insufficient documentation

## 2019-06-05 DIAGNOSIS — Z01411 Encounter for gynecological examination (general) (routine) with abnormal findings: Secondary | ICD-10-CM

## 2019-06-05 MED ORDER — TRIAMCINOLONE ACETONIDE 0.025 % EX OINT: 1.0000 "application " | TOPICAL_OINTMENT | Freq: Two times a day (BID) | CUTANEOUS | 1 refills | Status: DC

## 2019-06-05 NOTE — Patient Instructions (Signed)
Os cal caplet -- Calcium with Vit D

## 2019-06-05 NOTE — Progress Notes (Signed)
73 y.o. G78P1001 Married White or Caucasian female here for annual exam.  Doing well.  Denies vaginal bleeding.    Still working at [employer].  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   PCP:  Dr. Sharlett Iles.  Last appt was in May but this was changed to September.    Patient's last menstrual period was 11/12/1996 (approximate).          Sexually active: No.  The current method of family planning is post menopausal status.    Exercising: Yes.    walking, weights Smoker:  no  Health Maintenance: Pap:  02/18/17 neg  01/20/14 neg  History of abnormal Pap:  no MMG:  06/17/18 BIRADS1:neg  Colonoscopy:  05/25/14 polyps. F/u 5 years  BMD:   08/02/15 Osteoporosis  TDaP:  2013 Pneumonia vaccine(s):  2012 Shingrix:   Completed  Hep C testing: 02/18/17 Neg  Screening Labs: PCP   reports that she has never smoked. She has never used smokeless tobacco. She reports that she does not drink alcohol or use drugs.  Past Medical History:  Diagnosis Date  . Anemia 01/25/12   Slight  . Breast abscess 2005  . Carotid artery occlusion    right  . Gastric polyp 2011   hyperplastic  . Hyperlipidemia   . Lichen sclerosus et atrophicus of the vulva biopsy proven 08/11/2010  . Osteoporosis   . 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 APPENDECTOMY  01/08/2002  . KNEE SURGERY Left 1994  . PARATHYROIDECTOMY  06/2007  . TUBAL LIGATION  1983    Current Outpatient Medications  Medication Sig Dispense Refill  . alendronate (FOSAMAX) 70 MG tablet Take 1 tablet by mouth once a week.    Marland Kitchen aspirin 81 MG tablet Take 81 mg by mouth daily.    . Calcium Carbonate-Vitamin D (CALTRATE 600+D PO) Take by mouth.    . cholecalciferol (VITAMIN D) 1000 UNITS tablet Take 1,000 Units by mouth daily.    . cyanocobalamin 500 MCG tablet Take 500 mcg by mouth daily. Vitamin B12.    . simvastatin (ZOCOR) 40  MG tablet Take 40 mg by mouth every evening.    . triamcinolone (KENALOG) 0.025 % ointment Apply 1 application topically 2 (two) times daily. Do not use for more than 7 days. 30 g 1   No current facility-administered medications for this visit.     Family History  Problem Relation Age of Onset  . COPD Father   . Hypertension Mother   . Pulmonary embolism Mother   . COPD Brother        smoker    Review of Systems  All other systems reviewed and are negative.   Exam:   BP 126/70   Pulse 84   Temp 97.6 F (36.4 C) (Temporal)   Ht 5' 3.75" (1.619 m)   Wt 106 lb 3.2 oz (48.2 kg)   LMP 11/12/1996 (Approximate)   BMI 18.37 kg/m    Height: 5' 3.75" (161.9 cm)  Ht Readings from Last 3 Encounters:  06/05/19 5' 3.75" (1.619 m)  03/06/18 5\' 4"  (1.626 m)  02/24/18 5\' 4"  (1.626 m)    General appearance: alert, cooperative and appears stated age Head: Normocephalic, without obvious abnormality, atraumatic Neck: no adenopathy, supple, symmetrical, trachea midline and thyroid normal to inspection and palpation Lungs: clear to auscultation bilaterally Breasts:  normal appearance, no masses or tenderness Heart: regular rate and rhythm Abdomen: soft, non-tender; bowel sounds normal; no masses,  no organomegaly Extremities: extremities normal, atraumatic, no cyanosis or edema Skin: Skin color, texture, turgor normal. No rashes or lesions Lymph nodes: Cervical, supraclavicular, and axillary nodes normal. No abnormal inguinal nodes palpated Neurologic: Grossly normal   Pelvic: External genitalia:  no lesions              Urethra:  normal appearing urethra with no masses, tenderness or lesions              Bartholins and Skenes: normal                 Vagina: normal appearing vagina with normal color and discharge, no lesions              Cervix: no lesions              Pap taken: Yes.   Bimanual Exam:  Uterus:  normal size, contour, position, consistency, mobility, non-tender               Adnexa: normal adnexa and no mass, fullness, tenderness               Rectovaginal: Confirms               Anus:  normal sphincter tone, no lesions  Chaperone was present for exam.  A:  Well Woman with normal exam PMP, no HRT (stopped 2003) Carotid stenosis Vulvar vitiligo Lichen sclerosus, biopsy proven (2011) Osteoporosis, on Fosamax  P:   Mammogram guidelines reviewed. She is going to wait this year and plan to repeat next year pap smear obtained today Lab work planned with Dr. Sharlett Iles BMD due.  She will plan with Dr. Sharlett Iles RF for triamcinolone 0.25% ointment BID for up to 7 days.   Return annually or prn

## 2019-06-08 LAB — CYTOLOGY - PAP: Diagnosis: NEGATIVE

## 2019-07-22 DIAGNOSIS — R82998 Other abnormal findings in urine: Secondary | ICD-10-CM | POA: Diagnosis not present

## 2019-07-22 DIAGNOSIS — M810 Age-related osteoporosis without current pathological fracture: Secondary | ICD-10-CM | POA: Diagnosis not present

## 2019-07-22 DIAGNOSIS — E7849 Other hyperlipidemia: Secondary | ICD-10-CM | POA: Diagnosis not present

## 2019-07-24 DIAGNOSIS — I679 Cerebrovascular disease, unspecified: Secondary | ICD-10-CM | POA: Diagnosis not present

## 2019-07-24 DIAGNOSIS — D649 Anemia, unspecified: Secondary | ICD-10-CM | POA: Diagnosis not present

## 2019-07-24 DIAGNOSIS — H6981 Other specified disorders of Eustachian tube, right ear: Secondary | ICD-10-CM | POA: Diagnosis not present

## 2019-07-24 DIAGNOSIS — Z1339 Encounter for screening examination for other mental health and behavioral disorders: Secondary | ICD-10-CM | POA: Diagnosis not present

## 2019-07-24 DIAGNOSIS — M810 Age-related osteoporosis without current pathological fracture: Secondary | ICD-10-CM | POA: Diagnosis not present

## 2019-07-24 DIAGNOSIS — E785 Hyperlipidemia, unspecified: Secondary | ICD-10-CM | POA: Diagnosis not present

## 2019-07-24 DIAGNOSIS — Z0000 Encounter for general adult medical examination without abnormal findings: Secondary | ICD-10-CM | POA: Diagnosis not present

## 2019-07-24 DIAGNOSIS — Z1331 Encounter for screening for depression: Secondary | ICD-10-CM | POA: Diagnosis not present

## 2019-07-25 DIAGNOSIS — Z23 Encounter for immunization: Secondary | ICD-10-CM | POA: Diagnosis not present

## 2019-08-03 ENCOUNTER — Other Ambulatory Visit: Payer: Self-pay

## 2019-08-03 ENCOUNTER — Ambulatory Visit (HOSPITAL_COMMUNITY)
Admission: RE | Admit: 2019-08-03 | Discharge: 2019-08-03 | Disposition: A | Discharge status: Home / Self Care | Payer: PPO | Source: Ambulatory Visit | Attending: Family | Admitting: Family

## 2019-08-03 DIAGNOSIS — I6529 Occlusion and stenosis of unspecified carotid artery: Secondary | ICD-10-CM | POA: Insufficient documentation

## 2019-08-05 ENCOUNTER — Encounter: Payer: Self-pay | Admitting: Family

## 2019-08-05 ENCOUNTER — Ambulatory Visit (INDEPENDENT_AMBULATORY_CARE_PROVIDER_SITE_OTHER): Payer: PPO | Admitting: Family

## 2019-08-05 ENCOUNTER — Telehealth: Payer: Self-pay | Admitting: *Deleted

## 2019-08-05 ENCOUNTER — Other Ambulatory Visit: Payer: Self-pay

## 2019-08-05 VITALS — Ht 65.0 in | Wt 107.0 lb

## 2019-08-05 DIAGNOSIS — I773 Arterial fibromuscular dysplasia: Secondary | ICD-10-CM | POA: Diagnosis not present

## 2019-08-05 NOTE — Progress Notes (Signed)
Virtual Visit via Telephone Note  I connected with Tiffany Nguyen on 08/05/2019 using the [employer] by telephone and verified that I was speaking with the correct person using two identifiers. Patient was located at her home and accompanied by herself. I am located at the [facility] office/clinic.   The limitations of evaluation and management by telemedicine and the availability of in person appointments have been previously discussed with the patient and are documented in the patients chart. The patient expressed understanding and consented to proceed.  PCP: Leanna Battles, MD  Chief Complaint: follow up FMD of extracranial carotid arteries   History of Present Illness: Tiffany Nguyen is a 73 y.o. female whom Dr. Scot Dock has been monitoring for known carotid artery disease. At her 2013 visit she had a 60-79% left carotid stenosis with no significant stenosis on the right. The velocities did not change significantly over a year. 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  She denies any history of stroke, TIAs, expressive or receptive aphasia, or amaurosis fugax. She is on aspirin and is on a statin.  She has no claudication symptoms with walking.  Pt denies any problems with high blood pressure.  She takes B12 po daily.  Diabetic: no Tobacco use: non-smoker  Pt meds include: Statin : yes ASA: yes Other anticoagulants/antiplatelets: no   Past Medical History:  Diagnosis Date  . Anemia 01/25/12   Slight  . Breast abscess 2005  . Carotid artery occlusion    right  . Gastric polyp 2011   hyperplastic  . Hyperlipidemia   . Lichen sclerosus et atrophicus of the vulva biopsy proven 08/11/2010  . Osteoporosis   . 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 APPENDECTOMY  01/08/2002  . KNEE SURGERY Left 1994  . PARATHYROIDECTOMY  06/2007  . TUBAL LIGATION  1983    Current Meds  Medication  Sig  . alendronate (FOSAMAX) 70 MG tablet Take 1 tablet by mouth once a week.  Marland Kitchen aspirin 81 MG tablet Take 81 mg by mouth daily.  . calcium carbonate (OS-CAL - DOSED IN MG OF ELEMENTAL CALCIUM) 1250 (500 Ca) MG tablet Take 1 tablet by mouth.  . cholecalciferol (VITAMIN D) 1000 UNITS tablet Take 1,000 Units by mouth daily.  . cyanocobalamin 500 MCG tablet Take 500 mcg by mouth daily. Vitamin B12.  . simvastatin (ZOCOR) 40 MG tablet Take 40 mg by mouth every evening.  . triamcinolone (KENALOG) 0.025 % ointment Apply 1 application topically 2 (two) times daily. Do not use for more than 7 days.    12 system ROS was negative unless otherwise noted in HPI   Observations/Objective:  DATA  Carotid Duplex (08-03-19): Right Carotid: Velocities in the right ICA are consistent with a 1-39% stenosis. Left Carotid: Velocities in the left proximal ICA are consistent with a 1-39%               stenosis. Increased velocity as well as the flow pattern of the               left mid internal carotid artery consistent with fibromuscular               dysplasia. Vertebrals:  Bilateral vertebral arteries demonstrate antegrade flow. Subclavians: Normal flow hemodynamics were seen in bilateral subclavian arteries. No significant change noted when compared to the previous exams on 07-01-12, 02-25-13, 06-30-14, 07/06/15, 08-30-16,  and 03-06-18.    Assessment and Plan: Tiffany Nguyen is a 73 y.o. female who has no history of stroke or TIA. She has a left carotid bruit.  She has been followed for several years with stable extracranial carotid artery fibromuscular dysplasia.   Her atherosclerotic risk factors are minimal or not present: she does not have DM, has never used tobacco, has a low-normal BMI, stays physically active, takes a daily statin and ASA.     Follow Up Instructions:   Follow up 18 months with carotid duplex.   I discussed the assessment and treatment plan with the patient. The patient was  provided an opportunity to ask questions and all were answered. The patient agreed with the plan and demonstrated an understanding of the instructions.   The patient was advised to call back or seek an in-person evaluation if the symptoms worsen or if the condition fails to improve as anticipated.  I spent 5 minutes with the patient via telephone encounter.   Gabrielle Dare  [facility] Office: [phone]  08/05/2019, 3:35 PM

## 2019-08-05 NOTE — Telephone Encounter (Signed)
Virtual Visit Pre-Appointment Phone Call  Today, I spoke with Tiffany Nguyen and performed the following actions:  1. I explained that we are currently trying to limit exposure to the COVID-19 virus by seeing patients at home rather than in the office.  I explained that the visits are best done by video, but can be done by telephone.  I asked the patient if a virtual visit that the patient would like to try instead of coming into the office. Tiffany Nguyen agreed to proceed with the virtual visit scheduled with Tiffany Nguyen on 08/05/19 .     2. I confirmed the [facility]ST phone number to call the day of the visit and- I included this in appointment notes.  3. I asked if the patient had access to (through a family member/friend) a smartphone with video capability to be used for her visit?"  The patient said yes -    4. I confirmed consent by  a. sending through [place] or by email the [facility] as written at the end of this message or  b. verbally as listed below. i. This visit is being performed in the setting of COVID-19. ii. All virtual visits are billed to your insurance company just like a normal visit would be.   iii. We'd like you to understand that the technology does not allow for your provider to perform an examination, and thus may limit your provider's ability to fully assess your condition.  iv. If your provider identifies any concerns that need to be evaluated in person, we will make arrangements to do so.   v. Finally, though the technology is pretty good, we cannot assure that it will always work on either your or our end, and in the setting of a video visit, we may have to convert it to a phone-only visit.  In either situation, we cannot ensure that we have a secure connection.   vi. Are you willing to proceed?"  STAFF: Did the patient verbally acknowledge consent to telehealth visit? Document YES/NO here: YES  2. I advised the patient to be  prepared - I asked that the patient, on the day of her visit, record any information possible with the equipment at her home, such as blood pressure, pulse, oxygen saturation, and your weight and write them all down. I asked the patient to have a pen and paper handy nearby the day of the visit as well.  3. If the patient was scheduled for a video visit, I informed the patient that the visit with the doctor would start with a text to the smartphone # given to [place] by the patient.         If the patient was scheduled for a telephone call, I informed the patient that the visit with the doctor would start with a call to the telephone # given to [place] by the patient.  4. I Informed patient they will receive a phone call 15 minutes prior to their appointment time from a [place] or nurse to review medications, allergies, etc. to prepare for the visit.    TELEPHONE CALL NOTE  Tiffany Nguyen has been deemed a candidate for a follow-up tele-health visit to limit community exposure during the Covid-19 pandemic. I spoke with the patient via phone to ensure availability of phone/video source, confirm preferred email & phone number, and discuss instructions and expectations.  I reminded Tiffany Nguyen to be prepared with any vital sign  and/or heart rhythm information that could potentially be obtained via home monitoring, at the time of her visit. I reminded Tiffany Nguyen to expect a phone call prior to her visit.  Tiffany Nguyen, NT 08/05/2019 2:38 PM     FULL LENGTH CONSENT FOR TELE-HEALTH VISIT   I hereby voluntarily request, consent and authorize [facility] and its employed or contracted physicians, physician assistants, nurse practitioners or other licensed health care professionals (the Practitioner), to provide me with telemedicine health care services (the "Services") as deemed necessary by the treating Practitioner. I acknowledge and consent to receive the Services by the Practitioner via telemedicine.  I understand that the telemedicine visit will involve communicating with the Practitioner through live audiovisual communication technology and the disclosure of certain medical information by electronic transmission. I acknowledge that I have been given the opportunity to request an in-person assessment or other available alternative prior to the telemedicine visit and am voluntarily participating in the telemedicine visit.  I understand that I have the right to withhold or withdraw my consent to the use of telemedicine in the course of my care at any time, without affecting my right to future care or treatment, and that the Practitioner or I may terminate the telemedicine visit at any time. I understand that I have the right to inspect all information obtained and/or recorded in the course of the telemedicine visit and may receive copies of available information for a reasonable fee.  I understand that some of the potential risks of receiving the Services via telemedicine include:  Marland Kitchen Delay or interruption in medical evaluation due to technological equipment failure or disruption; . Information transmitted may not be sufficient (e.g. poor resolution of images) to allow for appropriate medical decision making by the Practitioner; and/or  . In rare instances, security protocols could fail, causing a breach of personal health information.  Furthermore, I acknowledge that it is my responsibility to provide information about my medical history, conditions and care that is complete and accurate to the best of my ability. I acknowledge that Practitioner's advice, recommendations, and/or decision may be based on factors not within their control, such as incomplete or inaccurate data provided by me or distortions of diagnostic images or specimens that may result from electronic transmissions. I understand that the practice of medicine is not an exact science and that Practitioner makes no warranties or guarantees  regarding treatment outcomes. I acknowledge that I will receive a copy of this consent concurrently upon execution via email to the email address I last provided but may also request a printed copy by calling the office of [facility].    I understand that my insurance will be billed for this visit.   I have read or had this consent read to me. . I understand the contents of this consent, which adequately explains the benefits and risks of the Services being provided via telemedicine.  . I have been provided ample opportunity to ask questions regarding this consent and the Services and have had my questions answered to my satisfaction. . I give my informed consent for the services to be provided through the use of telemedicine in my medical care  By participating in this telemedicine visit I agree to the above.

## 2019-08-05 NOTE — Patient Instructions (Signed)

## 2019-09-22 ENCOUNTER — Other Ambulatory Visit: Payer: Self-pay | Admitting: Obstetrics & Gynecology

## 2019-09-22 DIAGNOSIS — Z1231 Encounter for screening mammogram for malignant neoplasm of breast: Secondary | ICD-10-CM

## 2019-11-03 DIAGNOSIS — H5203 Hypermetropia, bilateral: Secondary | ICD-10-CM | POA: Diagnosis not present

## 2019-11-03 DIAGNOSIS — H524 Presbyopia: Secondary | ICD-10-CM | POA: Diagnosis not present

## 2019-11-03 DIAGNOSIS — H52223 Regular astigmatism, bilateral: Secondary | ICD-10-CM | POA: Diagnosis not present

## 2019-11-03 DIAGNOSIS — H33303 Unspecified retinal break, bilateral: Secondary | ICD-10-CM | POA: Diagnosis not present

## 2019-11-03 DIAGNOSIS — H35453 Secondary pigmentary degeneration, bilateral: Secondary | ICD-10-CM | POA: Diagnosis not present

## 2019-11-03 DIAGNOSIS — H25813 Combined forms of age-related cataract, bilateral: Secondary | ICD-10-CM | POA: Diagnosis not present

## 2019-11-12 ENCOUNTER — Ambulatory Visit: Payer: PPO

## 2019-12-03 ENCOUNTER — Ambulatory Visit: Payer: PPO | Attending: Internal Medicine

## 2019-12-03 DIAGNOSIS — Z23 Encounter for immunization: Secondary | ICD-10-CM

## 2019-12-03 NOTE — Progress Notes (Signed)
   Covid-19 Vaccination Clinic  Name:  Tiffany Nguyen    MRN: [id number] DOB: [birth year]  12/03/2019  Tiffany Nguyen was observed post Covid-19 immunization for 15 minutes without incidence. She was provided with Vaccine Information Sheet and instruction to access the V-Safe system.   Tiffany Nguyen was instructed to call 911 with any severe reactions post vaccine: Marland Kitchen Difficulty breathing  . Swelling of your face and throat  . A fast heartbeat  . A bad rash all over your body  . Dizziness and weakness    Immunizations Administered    Name Date Dose VIS Date Route   Pfizer COVID-19 Vaccine 12/03/2019  8:28 AM 0.3 mL 10/23/2019 Intramuscular   Manufacturer: [place]   Lot: [id number]   [place]: [id number]

## 2019-12-24 ENCOUNTER — Ambulatory Visit: Payer: PPO | Attending: Internal Medicine

## 2019-12-24 DIAGNOSIS — Z23 Encounter for immunization: Secondary | ICD-10-CM

## 2019-12-24 NOTE — Progress Notes (Signed)
   Covid-19 Vaccination Clinic  Name:  BIONKA BELMAR    MRN: [id number] DOB: 04-Dec-1945  12/24/2019  Ms. Valentini was observed post Covid-19 immunization for 15 minutes without incidence. She was provided with Vaccine Information Sheet and instruction to access the V-Safe system.   Ms. Demory was instructed to call 911 with any severe reactions post vaccine: Marland Kitchen Difficulty breathing  . Swelling of your face and throat  . A fast heartbeat  . A bad rash all over your body  . Dizziness and weakness    Immunizations Administered    Name Date Dose VIS Date Route   Pfizer COVID-19 Vaccine 12/24/2019  8:25 AM 0.3 mL 10/23/2019 Intramuscular   Manufacturer: [place]   Lot: [id number]   [place]: [id number]

## 2020-02-22 ENCOUNTER — Ambulatory Visit
Admission: RE | Admit: 2020-02-22 | Discharge: 2020-02-22 | Disposition: A | Discharge status: Home / Self Care | Payer: PPO | Source: Ambulatory Visit | Attending: Obstetrics & Gynecology | Admitting: Obstetrics & Gynecology

## 2020-02-22 ENCOUNTER — Other Ambulatory Visit: Payer: Self-pay

## 2020-02-22 DIAGNOSIS — Z1231 Encounter for screening mammogram for malignant neoplasm of breast: Secondary | ICD-10-CM | POA: Diagnosis not present

## 2020-05-31 ENCOUNTER — Ambulatory Visit: Payer: PPO | Admitting: Obstetrics & Gynecology

## 2020-06-10 NOTE — Progress Notes (Signed)
74 y.o. G51P1001 Married White or Caucasian female here for annual exam.  Doing well.  Continues to work at [employer].  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    Denies vaginal bleeding.    PCP:  Dr. Sharlett Iles.  Has appt with Dr. Sharlett Iles in September.  Will have blood work done the week before.    Patient's last menstrual period was 11/12/1996 (approximate).          Sexually active: No.  The current method of family planning is post menopausal status & tubal ligation.    Exercising: Yes.    walking Smoker:  no  Health Maintenance: Pap: 01-20-2014 neg, 02-18-17 neg, 06-05-2019 neg History of abnormal Pap:  no MMG:  02-23-2020 category c density birads 1:neg Colonoscopy:  05-25-14 polyps f/u 7 years.  This is different from colonoscopy report due to guideline changes.   BMD:   2016 osteoporosis.  She had one in 2018 at Dr. Buel Ream office.  Didn't do this last year due to Covid. TDaP:  2013 Pneumonia vaccine(s):  2012 Shingrix:   Done 2019 Hep C testing: neg 2018 Screening Labs: scheduled with Dr. Sharlett Iles in September.   reports that she has never smoked. She has never used smokeless tobacco. She reports that she does not drink alcohol and does not use drugs.  Past Medical History:  Diagnosis Date  . Anemia 01/25/12   Slight  . Breast abscess 2005  . Carotid artery occlusion    right  . Gastric polyp 2011   hyperplastic  . Hyperlipidemia   . Lichen sclerosus et atrophicus of the vulva biopsy proven 08/11/2010  . Osteoporosis   . Osteoporosis   . 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 APPENDECTOMY  01/08/2002  . KNEE SURGERY Left 1994  . PARATHYROIDECTOMY  06/2007  . TUBAL LIGATION  1983    Current Outpatient Medications  Medication Sig Dispense Refill  . alendronate (FOSAMAX) 70 MG tablet Take 1 tablet by mouth once a week.    Marland Kitchen aspirin 81 MG tablet Take 81 mg  by mouth daily.    . calcium carbonate (OS-CAL - DOSED IN MG OF ELEMENTAL CALCIUM) 1250 (500 Ca) MG tablet Take 1 tablet by mouth.    . cholecalciferol (VITAMIN D) 1000 UNITS tablet Take 1,000 Units by mouth daily.    . cyanocobalamin 500 MCG tablet Take 500 mcg by mouth daily. Vitamin B12.    . simvastatin (ZOCOR) 40 MG tablet Take 40 mg by mouth every evening.    . triamcinolone (KENALOG) 0.025 % ointment Apply 1 application topically 2 (two) times daily. Do not use for more than 7 days. 30 g 1   No current facility-administered medications for this visit.    Family History  Problem Relation Age of Onset  . COPD Father   . Hypertension Mother   . Pulmonary embolism Mother   . COPD Brother        smoker    Review of Systems  Constitutional: Negative.   HENT: Negative.   Eyes: Negative.   Respiratory: Negative.   Cardiovascular: Negative.   Gastrointestinal: Negative.   Endocrine: Negative.   Genitourinary: Negative.   Musculoskeletal: Negative.   Skin: Negative.   Allergic/Immunologic: Negative.   Neurological: Negative.   Hematological: Negative.   Psychiatric/Behavioral: Negative.     Exam:   BP 122/64  Pulse 68   Resp 14   Ht 5' 3.75" (1.619 m)   Wt 104 lb (47.2 kg)   LMP 11/12/1996 (Approximate)   BMI 17.99 kg/m   Height: 5' 3.75" (161.9 cm)  General appearance: alert, cooperative and appears stated age Head: Normocephalic, without obvious abnormality, atraumatic Neck: no adenopathy, supple, symmetrical, trachea midline and thyroid normal to inspection and palpation Lungs: clear to auscultation bilaterally Breasts: normal appearance, no masses or tenderness Heart: regular rate and rhythm Abdomen: soft, non-tender; bowel sounds normal; no masses,  no organomegaly Extremities: extremities normal, atraumatic, no cyanosis or edema Skin: Skin color, texture, turgor normal. No rashes or lesions Lymph nodes: Cervical, supraclavicular, and axillary nodes  normal. No abnormal inguinal nodes palpated Neurologic: Grossly normal   Pelvic: External genitalia:  no lesions              Urethra:  normal appearing urethra with no masses, tenderness or lesions              Bartholins and Skenes: normal                 Vagina: normal appearing vagina with normal color and discharge, no lesions              Cervix: no lesions              Pap taken: No. Bimanual Exam:  Uterus:  normal size, contour, position, consistency, mobility, non-tender              Adnexa: normal adnexa and no mass, fullness, tenderness               Rectovaginal: Confirms               Anus:  normal sphincter tone, no lesions  Chaperone, Terence Lux, CMA, was present for exam.  A:  Well Woman with normal exam PMP, no HRT (stopped 2003) Carotid stenosis, on ASA Vulvar vitiligo H/o biopsy proven lichen sclerosus Osteoporosis, on Fosamax  P:   Mammogram guidelines reveiwed pap smear neg 2020 RF for triamcinolone ointment.  She will call when needs it She is going to call if needs me to order BMD at the breast center Has appt with Dr Sharlett Iles next month.  Will do lab work the week before. Colonoscopy due 2022, instead of 2020 due to guideline changes Return annually or prn

## 2020-06-13 ENCOUNTER — Ambulatory Visit (INDEPENDENT_AMBULATORY_CARE_PROVIDER_SITE_OTHER): Payer: PPO | Admitting: Obstetrics & Gynecology

## 2020-06-13 ENCOUNTER — Encounter: Payer: Self-pay | Admitting: Obstetrics & Gynecology

## 2020-06-13 ENCOUNTER — Other Ambulatory Visit: Payer: Self-pay

## 2020-06-13 VITALS — BP 122/64 | HR 68 | Resp 14 | Ht 63.75 in | Wt 104.0 lb

## 2020-06-13 DIAGNOSIS — Z01419 Encounter for gynecological examination (general) (routine) without abnormal findings: Secondary | ICD-10-CM

## 2020-06-13 NOTE — Patient Instructions (Signed)
[facility] [address], [place], [place] [address] [phone]

## 2020-06-14 DIAGNOSIS — M5417 Radiculopathy, lumbosacral region: Secondary | ICD-10-CM | POA: Diagnosis not present

## 2020-06-23 DIAGNOSIS — M5416 Radiculopathy, lumbar region: Secondary | ICD-10-CM | POA: Diagnosis not present

## 2020-06-28 DIAGNOSIS — M5416 Radiculopathy, lumbar region: Secondary | ICD-10-CM | POA: Diagnosis not present

## 2020-07-07 DIAGNOSIS — M5416 Radiculopathy, lumbar region: Secondary | ICD-10-CM | POA: Diagnosis not present

## 2020-07-14 DIAGNOSIS — M5416 Radiculopathy, lumbar region: Secondary | ICD-10-CM | POA: Diagnosis not present

## 2020-07-21 DIAGNOSIS — M5416 Radiculopathy, lumbar region: Secondary | ICD-10-CM | POA: Diagnosis not present

## 2020-07-22 DIAGNOSIS — M810 Age-related osteoporosis without current pathological fracture: Secondary | ICD-10-CM | POA: Diagnosis not present

## 2020-07-22 DIAGNOSIS — E785 Hyperlipidemia, unspecified: Secondary | ICD-10-CM | POA: Diagnosis not present

## 2020-07-25 DIAGNOSIS — M5416 Radiculopathy, lumbar region: Secondary | ICD-10-CM | POA: Diagnosis not present

## 2020-07-29 DIAGNOSIS — E785 Hyperlipidemia, unspecified: Secondary | ICD-10-CM | POA: Diagnosis not present

## 2020-07-29 DIAGNOSIS — I679 Cerebrovascular disease, unspecified: Secondary | ICD-10-CM | POA: Diagnosis not present

## 2020-07-29 DIAGNOSIS — M545 Low back pain: Secondary | ICD-10-CM | POA: Diagnosis not present

## 2020-07-29 DIAGNOSIS — Z0000 Encounter for general adult medical examination without abnormal findings: Secondary | ICD-10-CM | POA: Diagnosis not present

## 2020-07-29 DIAGNOSIS — M542 Cervicalgia: Secondary | ICD-10-CM | POA: Diagnosis not present

## 2020-07-29 DIAGNOSIS — M810 Age-related osteoporosis without current pathological fracture: Secondary | ICD-10-CM | POA: Diagnosis not present

## 2020-07-29 DIAGNOSIS — R82998 Other abnormal findings in urine: Secondary | ICD-10-CM | POA: Diagnosis not present

## 2020-08-03 DIAGNOSIS — M5416 Radiculopathy, lumbar region: Secondary | ICD-10-CM | POA: Diagnosis not present

## 2020-08-10 DIAGNOSIS — Z1212 Encounter for screening for malignant neoplasm of rectum: Secondary | ICD-10-CM | POA: Diagnosis not present

## 2020-08-13 DIAGNOSIS — Z23 Encounter for immunization: Secondary | ICD-10-CM | POA: Diagnosis not present

## 2020-08-19 ENCOUNTER — Other Ambulatory Visit: Payer: Self-pay | Admitting: Internal Medicine

## 2020-08-19 DIAGNOSIS — M810 Age-related osteoporosis without current pathological fracture: Secondary | ICD-10-CM

## 2020-08-24 ENCOUNTER — Other Ambulatory Visit: Payer: PPO

## 2020-08-24 ENCOUNTER — Ambulatory Visit
Admission: RE | Admit: 2020-08-24 | Discharge: 2020-08-24 | Disposition: A | Discharge status: Home / Self Care | Payer: PPO | Source: Ambulatory Visit | Attending: Internal Medicine | Admitting: Internal Medicine

## 2020-08-24 ENCOUNTER — Other Ambulatory Visit: Payer: Self-pay

## 2020-08-24 DIAGNOSIS — M810 Age-related osteoporosis without current pathological fracture: Secondary | ICD-10-CM | POA: Diagnosis not present

## 2020-08-24 DIAGNOSIS — M8588 Other specified disorders of bone density and structure, other site: Secondary | ICD-10-CM | POA: Diagnosis not present

## 2020-08-24 DIAGNOSIS — Z780 Asymptomatic menopausal state: Secondary | ICD-10-CM | POA: Diagnosis not present

## 2020-08-27 ENCOUNTER — Ambulatory Visit: Payer: PPO | Attending: Internal Medicine

## 2020-08-27 DIAGNOSIS — Z23 Encounter for immunization: Secondary | ICD-10-CM

## 2020-08-27 NOTE — Progress Notes (Signed)
   Covid-19 Vaccination Clinic  Name:  JAMILA SLATTEN    MRN: [id number] DOB: 12-08-1945  08/27/2020  Ms. Allers was observed post Covid-19 immunization for 15 minutes without incident. She was provided with Vaccine Information Sheet and instruction to access the V-Safe system.   Ms. Fidalgo was instructed to call 911 with any severe reactions post vaccine: Marland Kitchen Difficulty breathing  . Swelling of face and throat  . A fast heartbeat  . A bad rash all over body  . Dizziness and weakness

## 2021-02-08 ENCOUNTER — Other Ambulatory Visit: Payer: Self-pay | Admitting: Obstetrics & Gynecology

## 2021-02-08 ENCOUNTER — Other Ambulatory Visit: Payer: Self-pay | Admitting: *Deleted

## 2021-02-08 DIAGNOSIS — I6529 Occlusion and stenosis of unspecified carotid artery: Secondary | ICD-10-CM

## 2021-02-08 DIAGNOSIS — Z1231 Encounter for screening mammogram for malignant neoplasm of breast: Secondary | ICD-10-CM

## 2021-02-15 ENCOUNTER — Encounter: Payer: Self-pay | Admitting: Vascular Surgery

## 2021-02-15 ENCOUNTER — Other Ambulatory Visit: Payer: Self-pay

## 2021-02-15 ENCOUNTER — Ambulatory Visit: Payer: PPO | Admitting: Vascular Surgery

## 2021-02-15 ENCOUNTER — Ambulatory Visit (HOSPITAL_COMMUNITY)
Admission: RE | Admit: 2021-02-15 | Discharge: 2021-02-15 | Disposition: A | Discharge status: Home / Self Care | Payer: PPO | Source: Ambulatory Visit | Attending: Vascular Surgery | Admitting: Vascular Surgery

## 2021-02-15 VITALS — BP 105/56 | HR 82 | Temp 98.7°F | Resp 20 | Ht 63.75 in | Wt 102.0 lb

## 2021-02-15 DIAGNOSIS — I6529 Occlusion and stenosis of unspecified carotid artery: Secondary | ICD-10-CM | POA: Diagnosis not present

## 2021-02-15 DIAGNOSIS — I773 Arterial fibromuscular dysplasia: Secondary | ICD-10-CM | POA: Diagnosis not present

## 2021-02-15 NOTE — Progress Notes (Signed)
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  She has been followed on a routine basis by our nurse practitioner.  On her duplex scan today these velocities have continued to improve and now she has no significant elevation in velocities bilaterally.  Given this finding I think we can continue to stretch her follow-up out gradually.  I ordered a follow-up carotid duplex scan in 18 months and I will see her back at that time.  She knows to remain on her aspirin and statin.  We have reviewed the symptoms of cerebrovascular disease and she knows to call if she develops any such symptoms.    HPI:   Tiffany Nguyen is a pleasant 75 y.o. female Seen back in 2015 with some elevated velocities in the left internal carotid artery but no significant stenosis.  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  She was asymptomatic.  She was followed by our nurse practitioner on a yearly basis.  This has been stable and she comes in for an 43-month follow-up visit.  Since she was seen last she denies any history of stroke, TIAs, expressive or receptive aphasia, or amaurosis fugax.  She is on 81 mg of aspirin daily and is also on a statin.  She is not a smoker.  She did injure her back lifting something but is doing well after some physical therapy.  Past Medical History:  Diagnosis Date  . Anemia 01/25/12   Slight  . Breast abscess 2005  . Carotid artery occlusion    right  . Gastric polyp 2011   hyperplastic  . Hyperlipidemia   . Lichen sclerosus et atrophicus of the vulva biopsy proven 08/11/2010  . Osteoporosis   . 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 COPD Father   . Hypertension Mother   . Pulmonary embolism  Mother   . COPD Brother        smoker    SOCIAL HISTORY: Social History   Tobacco Use  . Smoking status: Never Smoker  . Smokeless tobacco: Never Used  Substance Use Topics  . Alcohol use: No    Allergies  Allergen Reactions  . Hydrocodone-Ibuprofen Nausea And Vomiting    Current Outpatient Medications  Medication Sig Dispense Refill  . alendronate (FOSAMAX) 70 MG tablet Take 1 tablet by mouth once a week.    Marland Kitchen aspirin 81 MG tablet Take 81 mg by mouth daily.    . calcium carbonate (OS-CAL - DOSED IN MG OF ELEMENTAL CALCIUM) 1250 (500 Ca) MG tablet Take 1 tablet by mouth.    . cholecalciferol (VITAMIN D) 1000 UNITS tablet Take 1,000 Units by mouth daily.    . cyanocobalamin 500 MCG tablet Take 500 mcg by mouth daily. Vitamin B12.    . simvastatin (ZOCOR) 40 MG tablet Take 40 mg by mouth every evening.    . triamcinolone (KENALOG) 0.025 % ointment Apply 1 application topically 2 (two) times daily. Do not use for more than 7 days. (Patient not taking: Reported on 02/15/2021) 30 g 1   No current facility-administered medications for this visit.    REVIEW OF SYSTEMS:  [X]  denotes positive finding, [ ]  denotes negative finding Cardiac  Comments:  Chest pain  or chest pressure:    Shortness of breath upon exertion:    Short of breath when lying flat:    Irregular heart rhythm:        Vascular    Pain in calf, thigh, or hip brought on by ambulation:    Pain in feet at night that wakes you up from your sleep:     Blood clot in your veins:    Leg swelling:         Pulmonary    Oxygen at home:    Productive cough:     Wheezing:         Neurologic    Sudden weakness in arms or legs:     Sudden numbness in arms or legs:     Sudden onset of difficulty speaking or slurred speech:    Temporary loss of vision in one eye:     Problems with dizziness:         Gastrointestinal    Blood in stool:     Vomited blood:         Genitourinary    Burning when urinating:     Blood in  urine:        Psychiatric    Major depression:         Hematologic    Bleeding problems:    Problems with blood clotting too easily:        Skin    Rashes or ulcers:        Constitutional    Fever or chills:     PHYSICAL EXAM:   Vitals:   02/15/21 1431 02/15/21 1435  BP: 109/64 (!) 105/56  Pulse: 82   Resp: 20   Temp: 98.7 F (37.1 C)   SpO2: 96%   Weight: 102 lb (46.3 kg)   Height: 5' 3.75" (1.619 m)     GENERAL: The patient is a well-nourished female, in no acute distress. The vital signs are documented above. CARDIAC: There is a regular rate and rhythm.  VASCULAR: She has bilateral carotid bruits. Both feet are warm and well perfused. PULMONARY: There is good air exchange bilaterally without wheezing or rales. ABDOMEN: Soft and non-tender with normal pitched bowel sounds.  MUSCULOSKELETAL: There are no major deformities or cyanosis. NEUROLOGIC: No focal weakness or paresthesias are detected. SKIN: There are no ulcers or rashes noted. PSYCHIATRIC: The patient has a normal affect.  DATA:    CAROTID DUPLEX: I have independently interpreted her carotid duplex scan today.  On the right side there is a less than 39% stenosis.  The right vertebral artery is patent with antegrade flow.  On the left side, there is a less than 39% stenosis.  The left vertebral artery is patent with antegrade flow.  Deitra Mayo [facility] [phone]

## 2021-03-30 ENCOUNTER — Other Ambulatory Visit: Payer: Self-pay

## 2021-03-30 ENCOUNTER — Ambulatory Visit
Admission: RE | Admit: 2021-03-30 | Discharge: 2021-03-30 | Disposition: A | Discharge status: Home / Self Care | Payer: PPO | Source: Ambulatory Visit | Attending: Obstetrics & Gynecology | Admitting: Obstetrics & Gynecology

## 2021-03-30 DIAGNOSIS — Z1231 Encounter for screening mammogram for malignant neoplasm of breast: Secondary | ICD-10-CM | POA: Diagnosis not present

## 2021-05-05 DIAGNOSIS — E785 Hyperlipidemia, unspecified: Secondary | ICD-10-CM | POA: Diagnosis not present

## 2021-05-05 DIAGNOSIS — D649 Anemia, unspecified: Secondary | ICD-10-CM | POA: Diagnosis not present

## 2021-05-05 DIAGNOSIS — G44039 Episodic paroxysmal hemicrania, not intractable: Secondary | ICD-10-CM | POA: Diagnosis not present

## 2021-05-05 DIAGNOSIS — R1312 Dysphagia, oropharyngeal phase: Secondary | ICD-10-CM | POA: Diagnosis not present

## 2021-05-05 DIAGNOSIS — L989 Disorder of the skin and subcutaneous tissue, unspecified: Secondary | ICD-10-CM | POA: Diagnosis not present

## 2021-05-05 DIAGNOSIS — G8929 Other chronic pain: Secondary | ICD-10-CM | POA: Diagnosis not present

## 2021-05-05 DIAGNOSIS — M545 Low back pain, unspecified: Secondary | ICD-10-CM | POA: Diagnosis not present

## 2021-05-05 DIAGNOSIS — R634 Abnormal weight loss: Secondary | ICD-10-CM | POA: Diagnosis not present

## 2021-05-05 DIAGNOSIS — M810 Age-related osteoporosis without current pathological fracture: Secondary | ICD-10-CM | POA: Diagnosis not present

## 2021-05-08 ENCOUNTER — Other Ambulatory Visit: Payer: Self-pay | Admitting: Internal Medicine

## 2021-05-08 DIAGNOSIS — D649 Anemia, unspecified: Secondary | ICD-10-CM | POA: Diagnosis not present

## 2021-05-08 DIAGNOSIS — R1312 Dysphagia, oropharyngeal phase: Secondary | ICD-10-CM

## 2021-05-19 ENCOUNTER — Ambulatory Visit
Admission: RE | Admit: 2021-05-19 | Discharge: 2021-05-19 | Disposition: A | Discharge status: Home / Self Care | Payer: PPO | Source: Ambulatory Visit | Attending: Internal Medicine | Admitting: Internal Medicine

## 2021-05-19 DIAGNOSIS — K224 Dyskinesia of esophagus: Secondary | ICD-10-CM | POA: Diagnosis not present

## 2021-05-19 DIAGNOSIS — R1312 Dysphagia, oropharyngeal phase: Secondary | ICD-10-CM

## 2021-05-31 ENCOUNTER — Other Ambulatory Visit: Payer: Self-pay

## 2021-05-31 ENCOUNTER — Encounter: Payer: Self-pay | Admitting: Vascular Surgery

## 2021-05-31 ENCOUNTER — Ambulatory Visit: Payer: PPO | Admitting: Vascular Surgery

## 2021-05-31 VITALS — BP 116/72 | HR 74 | Temp 97.8°F | Ht 65.0 in | Wt 94.4 lb

## 2021-05-31 DIAGNOSIS — M316 Other giant cell arteritis: Secondary | ICD-10-CM

## 2021-05-31 NOTE — Progress Notes (Addendum)
ASSESSMENT & PLAN   RULE OUT TEMPORAL ARTERITIS: The patient has been having some intermittent headaches bilaterally and also some pain over the temporal region bilaterally.  The sed rate is markedly elevated and I would agree that temporal arteritis is in the differential diagnosis.  I would agree with plans for bilateral temporal artery biopsies.  This has been scheduled for 06/06/2021.  The results will need to be sent to Dr. Sharlett Nguyen.  If she does have temporal arteritis she would need to be on steroids.  If the biopsy is negative then she would obviously not need steroids.  She is having multiple other complaints and I think will be helpful to rule out temporal arteritis as a potential cause of some of her other complaints.  I have discussed the indications for the procedure with the patient and the potential complications.  She is agreeable to proceed.  She has a left carotid stenosis but had a carotid duplex scan in April of this year which showed no significant carotid disease on either side.  REASON FOR CONSULT:    Possible temporal arteritis.  The consult is requested by Dr. Bevelyn Buckles.  HPI:   Tiffany Nguyen is a 75 y.o. female who was noted to have prominent temporal arteries and increased sed rate.  She has been sent to evaluate for potential temporal arteritis.  I have reviewed the records from the referring office.  The patient was seen on 05/05/2021 as she had been having headaches centered in the temporal region.  She was having bilateral temporal aching and was noted to have prominent temporal arteries.  Her sed rate is 93.  On my history the patient has had some intermittent bifrontal headaches.  She also has had some tenderness over the temporal arteries at times although this is not constant.  She said no visual disturbances.  She has multiple other complaints including puffiness under her eyes, poor appetite, and weight loss.  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     aspirin 81 MG tablet Take 81 mg by mouth daily.     calcium carbonate (OS-CAL - DOSED IN MG OF ELEMENTAL CALCIUM) 1250 (500 Ca) MG tablet Take 1 tablet by mouth.     cholecalciferol (VITAMIN D) 1000 UNITS tablet Take 1,000 Units by mouth daily.     cyanocobalamin 500 MCG tablet Take 500 mcg by mouth daily. Vitamin B12.     simvastatin (ZOCOR) 40 MG tablet Take 40 mg by mouth every evening.  triamcinolone (KENALOG) 0.025 % ointment Apply 1 application topically 2 (two) times daily. Do not use for more than 7 days. 30 g 1   No current facility-administered medications for this visit.    REVIEW OF SYSTEMS:  [X]  denotes positive finding, [ ]  denotes negative finding Cardiac  Comments:  Chest pain or chest pressure:    Shortness of breath upon exertion:    Short of breath when lying flat:    Irregular heart rhythm:        Vascular    Pain in calf, thigh, or hip brought on by ambulation:    Pain in feet at night that wakes you up from your sleep:     Blood clot in your veins:    Leg swelling:         Pulmonary    Oxygen at home:    Productive cough:     Wheezing:          Neurologic    Sudden weakness in arms or legs:     Sudden numbness in arms or legs:     Sudden onset of difficulty speaking or slurred speech:    Temporary loss of vision in one eye:     Problems with dizziness:         Gastrointestinal    Blood in stool:     Vomited blood:         Genitourinary    Burning when urinating:     Blood in urine:        Psychiatric    Major depression:         Hematologic    Bleeding problems:    Problems with blood clotting too easily:        Skin    Rashes or ulcers:        Constitutional    Fever or chills:    -  PHYSICAL EXAM:   Vitals:   05/31/21 0840  BP: 116/72  Pulse: 74  Temp: 97.8 F (36.6 C)  SpO2: 99%  Weight: 94 lb 6.4 oz (42.8 kg)  Height: 5\' 5"  (1.651 m)   Body mass index is 15.71 kg/m. GENERAL: The patient is a well-nourished female, in no acute distress. The vital signs are documented above. CARDIAC: There is a regular rate and rhythm.  VASCULAR: She has a left carotid bruit. She has palpable radial pulses. Currently she does not have significant tenderness over her temporal artery bilaterally. PULMONARY: There is good air exchange bilaterally without wheezing or rales. ABDOMEN: Soft and non-tender with normal pitched bowel sounds.  MUSCULOSKELETAL: There are no major deformities. NEUROLOGIC: No focal weakness or paresthesias are detected. SKIN: There are no ulcers or rashes noted. PSYCHIATRIC: The patient has a normal affect.  DATA:    LABS: Sed rate is 92.  Deitra Mayo [facility]

## 2021-05-31 NOTE — H&P (View-Only) (Signed)
ASSESSMENT & PLAN   RULE OUT TEMPORAL ARTERITIS: The patient has been having some intermittent headaches bilaterally and also some pain over the temporal region bilaterally.  The sed rate is markedly elevated and I would agree that temporal arteritis is in the differential diagnosis.  I would agree with plans for bilateral temporal artery biopsies.  This has been scheduled for 06/06/2021.  The results will need to be sent to Dr. Sharlett Iles.  If she does have temporal arteritis she would need to be on steroids.  If the biopsy is negative then she would obviously not need steroids.  She is having multiple other complaints and I think will be helpful to rule out temporal arteritis as a potential cause of some of her other complaints.  I have discussed the indications for the procedure with the patient and the potential complications.  She is agreeable to proceed.  She has a left carotid stenosis but had a carotid duplex scan in April of this year which showed no significant carotid disease on either side.  REASON FOR CONSULT:    Possible temporal arteritis.  The consult is requested by Dr. Bevelyn Buckles.  HPI:   Tiffany Nguyen is a 75 y.o. female who was noted to have prominent temporal arteries and increased sed rate.  She has been sent to evaluate for potential temporal arteritis.  I have reviewed the records from the referring office.  The patient was seen on 05/05/2021 as she had been having headaches centered in the temporal region.  She was having bilateral temporal aching and was noted to have prominent temporal arteries.  Her sed rate is 93.  On my history the patient has had some intermittent bifrontal headaches.  She also has had some tenderness over the temporal arteries at times although this is not constant.  She said no visual disturbances.  She has multiple other complaints including puffiness under her eyes, poor appetite, and weight loss.  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     aspirin 81 MG tablet Take 81 mg by mouth daily.     calcium carbonate (OS-CAL - DOSED IN MG OF ELEMENTAL CALCIUM) 1250 (500 Ca) MG tablet Take 1 tablet by mouth.     cholecalciferol (VITAMIN D) 1000 UNITS tablet Take 1,000 Units by mouth daily.     cyanocobalamin 500 MCG tablet Take 500 mcg by mouth daily. Vitamin B12.     simvastatin (ZOCOR) 40 MG tablet Take 40 mg by mouth every evening.  triamcinolone (KENALOG) 0.025 % ointment Apply 1 application topically 2 (two) times daily. Do not use for more than 7 days. 30 g 1   No current facility-administered medications for this visit.    REVIEW OF SYSTEMS:  [X]  denotes positive finding, [ ]  denotes negative finding Cardiac  Comments:  Chest pain or chest pressure:    Shortness of breath upon exertion:    Short of breath when lying flat:    Irregular heart rhythm:        Vascular    Pain in calf, thigh, or hip brought on by ambulation:    Pain in feet at night that wakes you up from your sleep:     Blood clot in your veins:    Leg swelling:         Pulmonary    Oxygen at home:    Productive cough:     Wheezing:          Neurologic    Sudden weakness in arms or legs:     Sudden numbness in arms or legs:     Sudden onset of difficulty speaking or slurred speech:    Temporary loss of vision in one eye:     Problems with dizziness:         Gastrointestinal    Blood in stool:     Vomited blood:         Genitourinary    Burning when urinating:     Blood in urine:        Psychiatric    Major depression:         Hematologic    Bleeding problems:    Problems with blood clotting too easily:        Skin    Rashes or ulcers:        Constitutional    Fever or chills:    -  PHYSICAL EXAM:   Vitals:   05/31/21 0840  BP: 116/72  Pulse: 74  Temp: 97.8 F (36.6 C)  SpO2: 99%  Weight: 94 lb 6.4 oz (42.8 kg)  Height: 5\' 5"  (1.651 m)   Body mass index is 15.71 kg/m. GENERAL: The patient is a well-nourished female, in no acute distress. The vital signs are documented above. CARDIAC: There is a regular rate and rhythm.  VASCULAR: She has a left carotid bruit. She has palpable radial pulses. Currently she does not have significant tenderness over her temporal artery bilaterally. PULMONARY: There is good air exchange bilaterally without wheezing or rales. ABDOMEN: Soft and non-tender with normal pitched bowel sounds.  MUSCULOSKELETAL: There are no major deformities. NEUROLOGIC: No focal weakness or paresthesias are detected. SKIN: There are no ulcers or rashes noted. PSYCHIATRIC: The patient has a normal affect.  DATA:    LABS: Sed rate is 92.  Deitra Mayo [facility]

## 2021-06-01 NOTE — Pre-Procedure Instructions (Signed)
Surgical Instructions    Your procedure is scheduled on Tuesday June 06, 2021.   Report to [facility] Main Entrance "A" at 08:15 A.M., then check in with the Admitting office.  Call this number if you have problems the morning of surgery:  [phone]   If you have any questions prior to your surgery date call [phone]: Open Monday-Friday 8am-4pm    Remember:  Do not eat or drink after midnight the night before your surgery   Take these medicines the morning of surgery with A SIP OF WATER   triamcinolone (KENALOG)- If needed    As of today, STOP taking any Aspirin (unless otherwise instructed by your surgeon) Aleve, Naproxen, Ibuprofen, Motrin, Advil, Goody's, BC's, all herbal medications, fish oil, and all vitamins.                     Do NOT Smoke (Tobacco/Vaping) or drink Alcohol 24 hours prior to your procedure.  If you use a CPAP at night, you may bring all equipment for your overnight stay.   Contacts, glasses, piercing's, hearing aid's, dentures or partials may not be worn into surgery, please bring cases for these belongings.    For patients admitted to the hospital, discharge time will be determined by your treatment team.   Patients discharged the day of surgery will not be allowed to drive home, and someone needs to stay with them for 24 hours.  ONLY 1 SUPPORT PERSON MAY BE PRESENT WHILE YOU ARE IN SURGERY. IF YOU ARE TO BE ADMITTED ONCE YOU ARE IN YOUR ROOM YOU WILL BE ALLOWED TWO (2) VISITORS.  Minor children may have two parents present. Special consideration for safety and communication needs will be reviewed on a case by case basis.   Special instructions:   [facility]- Preparing For Surgery  Before surgery, you can play an important role. Because skin is not sterile, your skin needs to be as free of germs as possible. You can reduce the number of germs on your skin by washing with CHG (chlorahexidine gluconate) Soap before surgery.  CHG is an antiseptic  cleaner which kills germs and bonds with the skin to continue killing germs even after washing.    Oral Hygiene is also important to reduce your risk of infection.  Remember - BRUSH YOUR TEETH THE MORNING OF SURGERY WITH YOUR REGULAR TOOTHPASTE  Please do not use if you have an allergy to CHG or antibacterial soaps. If your skin becomes reddened/irritated stop using the CHG.  Do not shave (including legs and underarms) for at least 48 hours prior to first CHG shower. It is OK to shave your face.  Please follow these instructions carefully.   Shower the NIGHT BEFORE SURGERY and the MORNING OF SURGERY  If you chose to wash your hair, wash your hair first as usual with your normal shampoo.  After you shampoo, rinse your hair and body thoroughly to remove the shampoo.  Use CHG Soap as you would any other liquid soap. You can apply CHG directly to the skin and wash gently with a scrungie or a clean washcloth.   Apply the CHG Soap to your body ONLY FROM THE NECK DOWN.  Do not use on open wounds or open sores. Avoid contact with your eyes, ears, mouth and genitals (private parts). Wash Face and genitals (private parts)  with your normal soap.   Wash thoroughly, paying special attention to the area where your surgery will be performed.  Thoroughly  rinse your body with warm water from the neck down.  DO NOT shower/wash with your normal soap after using and rinsing off the CHG Soap.  Pat yourself dry with a CLEAN TOWEL.  Wear CLEAN PAJAMAS to bed the night before surgery  Place CLEAN SHEETS on your bed the night before your surgery  DO NOT SLEEP WITH PETS.   Day of Surgery: Shower with CHG soap. Do not wear jewelry, make up, nail polish, gel polish, artificial nails, or any other type of covering on natural nails including finger and toenails. If patients have artificial nails, gel coating, etc. that need to be removed by a nail salon please have this removed prior to surgery. Surgery may  need to be canceled/delayed if the surgeon/ anesthesia feels like the patient is unable to be adequately monitored. Do not wear lotions, powders, perfumes/colognes, or deodorant. Do not shave 48 hours prior to surgery.  Men may shave face and neck. Do not bring valuables to the hospital. [facility] is not responsible for any belongings or valuables. Wear Clean/Comfortable clothing the morning of surgery Remember to brush your teeth WITH YOUR REGULAR TOOTHPASTE.   Please read over the following fact sheets that you were given.

## 2021-06-02 ENCOUNTER — Other Ambulatory Visit: Payer: Self-pay

## 2021-06-02 ENCOUNTER — Encounter (HOSPITAL_COMMUNITY)
Admission: RE | Admit: 2021-06-02 | Discharge: 2021-06-02 | Disposition: A | Discharge status: Home / Self Care | Payer: PPO | Source: Ambulatory Visit | Attending: Vascular Surgery | Admitting: Vascular Surgery

## 2021-06-02 ENCOUNTER — Encounter (HOSPITAL_COMMUNITY): Payer: Self-pay

## 2021-06-02 DIAGNOSIS — Z01812 Encounter for preprocedural laboratory examination: Secondary | ICD-10-CM | POA: Diagnosis not present

## 2021-06-02 HISTORY — DX: Headache, unspecified: R51.9

## 2021-06-02 LAB — BASIC METABOLIC PANEL
Anion gap: 8 (ref 5–15)
BUN: 12 mg/dL (ref 8–23)
CO2: 26 mmol/L (ref 22–32)
Calcium: 9.1 mg/dL (ref 8.9–10.3)
Chloride: 103 mmol/L (ref 98–111)
Creatinine, Ser: 0.58 mg/dL (ref 0.44–1.00)
GFR, Estimated: >60 mL/min (ref >60)
Glucose, Bld: 99 mg/dL (ref 70–99)
Potassium: 3.8 mmol/L (ref 3.5–5.1)
Sodium: 137 mmol/L (ref 135–145)

## 2021-06-02 LAB — SURGICAL PCR SCREEN
MRSA, PCR: NEGATIVE
Staphylococcus aureus: NEGATIVE

## 2021-06-02 LAB — CBC
HCT: 32.2 % — ABNORMAL LOW (ref 36.0–46.0)
Hemoglobin: 9.8 g/dL — ABNORMAL LOW (ref 12.0–15.0)
MCH: 26.4 pg (ref 26.0–34.0)
MCHC: 30.4 g/dL (ref 30.0–36.0)
MCV: 86.8 fL (ref 80.0–100.0)
Platelets: 282 10*3/uL (ref 150–400)
RBC: 3.71 MIL/uL — ABNORMAL LOW (ref 3.87–5.11)
RDW: 14.6 % (ref 11.5–15.5)
WBC: 4.9 10*3/uL (ref 4.0–10.5)
nRBC: 0 % (ref 0.0–0.2)

## 2021-06-02 NOTE — Pre-Procedure Instructions (Signed)
Surgical Instructions    Your procedure is scheduled on Tuesday June 06, 2021.   Report to [facility] Main Entrance "A" at 08:15 A.M., then check in with the Admitting office.  Call this number if you have problems the morning of surgery:  [phone]   If you have any questions prior to your surgery date call [phone]: Open Monday-Friday 8am-4pm    Remember:  Do not eat or drink after midnight the night before your surgery   Take these medicines the morning of surgery with A SIP OF WATER   NONE  Follow your surgeon's instructions on when to stop Aspirin.  If no instructions were given by your surgeon then you will need to call the office to get those instructions.       As of today, STOP taking any Aspirin (unless otherwise instructed by your surgeon) Aleve, Naproxen, Ibuprofen, Motrin, Advil, Goody's, BC's, all herbal medications, fish oil, and all vitamins.                     Do NOT Smoke (Tobacco/Vaping) or drink Alcohol 24 hours prior to your procedure.  If you use a CPAP at night, you may bring all equipment for your overnight stay.   Contacts, glasses, piercing's, hearing aid's, dentures or partials may not be worn into surgery, please bring cases for these belongings.    For patients admitted to the hospital, discharge time will be determined by your treatment team.   Patients discharged the day of surgery will not be allowed to drive home, and someone needs to stay with them for 24 hours.  ONLY 1 SUPPORT PERSON MAY BE PRESENT WHILE YOU ARE IN SURGERY. IF YOU ARE TO BE ADMITTED ONCE YOU ARE IN YOUR ROOM YOU WILL BE ALLOWED TWO (2) VISITORS.  Minor children may have two parents present. Special consideration for safety and communication needs will be reviewed on a case by case basis.   Special instructions:   [place]- Preparing For Surgery  Before surgery, you can play an important role. Because skin is not sterile, your skin needs to be as free of germs as  possible. You can reduce the number of germs on your skin by washing with CHG (chlorahexidine gluconate) Soap before surgery.  CHG is an antiseptic cleaner which kills germs and bonds with the skin to continue killing germs even after washing.    Oral Hygiene is also important to reduce your risk of infection.  Remember - BRUSH YOUR TEETH THE MORNING OF SURGERY WITH YOUR REGULAR TOOTHPASTE  Please do not use if you have an allergy to CHG or antibacterial soaps. If your skin becomes reddened/irritated stop using the CHG.  Do not shave (including legs and underarms) for at least 48 hours prior to first CHG shower. It is OK to shave your face.  Please follow these instructions carefully.   Shower the NIGHT BEFORE SURGERY and the MORNING OF SURGERY  If you chose to wash your hair, wash your hair first as usual with your normal shampoo.  After you shampoo, rinse your hair and body thoroughly to remove the shampoo.  Use CHG Soap as you would any other liquid soap. You can apply CHG directly to the skin and wash gently with a scrungie or a clean washcloth.   Apply the CHG Soap to your body ONLY FROM THE NECK DOWN.  Do not use on open wounds or open sores. Avoid contact with your eyes, ears, mouth and genitals (  private parts). Wash Face and genitals (private parts)  with your normal soap.   Wash thoroughly, paying special attention to the area where your surgery will be performed.  Thoroughly rinse your body with warm water from the neck down.  DO NOT shower/wash with your normal soap after using and rinsing off the CHG Soap.  Pat yourself dry with a CLEAN TOWEL.  Wear CLEAN PAJAMAS to bed the night before surgery  Place CLEAN SHEETS on your bed the night before your surgery  DO NOT SLEEP WITH PETS.   Day of Surgery: Shower with CHG soap. Do not wear jewelry, make up, nail polish, gel polish, artificial nails, or any other type of covering on natural nails including finger and  toenails. If patients have artificial nails, gel coating, etc. that need to be removed by a nail salon please have this removed prior to surgery. Surgery may need to be canceled/delayed if the surgeon/ anesthesia feels like the patient is unable to be adequately monitored. Do not wear lotions, powders, perfumes/colognes, or deodorant. Do not shave 48 hours prior to surgery.  Men may shave face and neck. Do not bring valuables to the hospital. [facility] is not responsible for any belongings or valuables. Wear Clean/Comfortable clothing the morning of surgery Remember to brush your teeth WITH YOUR REGULAR TOOTHPASTE.   Please read over the following fact sheets that you were given.

## 2021-06-02 NOTE — Addendum Note (Signed)
Addended by: Nicholas Lose on: 06/02/2021 10:13 AM   Modules accepted: Orders

## 2021-06-02 NOTE — Progress Notes (Addendum)
PCP - Leanna Battles Cardiologist - Pt denies  PPM/ICD - n/a   Chest x-ray - n/a EKG - n/a Stress Test - n/a ECHO - 07/27/10 Cardiac Cath - n/a  Sleep Study - n/a  Fasting Blood Sugar - n/a  Blood Thinner Instructions: n/a Aspirin Instructions: Pt will call prescriber about when to stop taking aspirin  NPO after midnight  COVID TEST- n/a  **Consent will be updated to "bilateral" instead of right side; surgeon's office contacted and will update. Pt will need to sign consent DOS.  Anesthesia review: no  Patient denies shortness of breath, fever, cough and chest pain at PAT appointment   All instructions explained to the patient, with a verbal understanding of the material. Patient agrees to go over the instructions while at home for a better understanding. Patient also instructed to self quarantine after being tested for COVID-19. The opportunity to ask questions was provided.

## 2021-06-02 NOTE — Progress Notes (Signed)
Pt's Hgb 9.8 from CBC drawn at PAT appointment. Surgeon's office called and notified, VM left. Chart marked for anesthesia review. Requested recent lab work from PCP, MD [place] to be faxed.

## 2021-06-06 ENCOUNTER — Ambulatory Visit (HOSPITAL_COMMUNITY)
Admission: RE | Admit: 2021-06-06 | Discharge: 2021-06-06 | Disposition: A | Discharge status: Home / Self Care | Payer: PPO | Attending: Vascular Surgery | Admitting: Vascular Surgery

## 2021-06-06 ENCOUNTER — Encounter (HOSPITAL_COMMUNITY): Payer: Self-pay | Admitting: Vascular Surgery

## 2021-06-06 ENCOUNTER — Encounter (HOSPITAL_COMMUNITY)
Admission: RE | Disposition: A | Discharge status: Home / Self Care | Payer: Self-pay | Source: Home / Self Care | Attending: Vascular Surgery

## 2021-06-06 ENCOUNTER — Other Ambulatory Visit: Payer: Self-pay

## 2021-06-06 ENCOUNTER — Ambulatory Visit (HOSPITAL_COMMUNITY): Payer: PPO | Admitting: Physician Assistant

## 2021-06-06 ENCOUNTER — Ambulatory Visit (HOSPITAL_COMMUNITY): Payer: PPO

## 2021-06-06 DIAGNOSIS — Z885 Allergy status to narcotic agent status: Secondary | ICD-10-CM | POA: Insufficient documentation

## 2021-06-06 DIAGNOSIS — Z7983 Long term (current) use of bisphosphonates: Secondary | ICD-10-CM | POA: Insufficient documentation

## 2021-06-06 DIAGNOSIS — M810 Age-related osteoporosis without current pathological fracture: Secondary | ICD-10-CM | POA: Diagnosis not present

## 2021-06-06 DIAGNOSIS — Z79899 Other long term (current) drug therapy: Secondary | ICD-10-CM | POA: Insufficient documentation

## 2021-06-06 DIAGNOSIS — E785 Hyperlipidemia, unspecified: Secondary | ICD-10-CM | POA: Diagnosis not present

## 2021-06-06 DIAGNOSIS — Z7982 Long term (current) use of aspirin: Secondary | ICD-10-CM | POA: Diagnosis not present

## 2021-06-06 DIAGNOSIS — E559 Vitamin D deficiency, unspecified: Secondary | ICD-10-CM | POA: Diagnosis not present

## 2021-06-06 DIAGNOSIS — R519 Headache, unspecified: Secondary | ICD-10-CM | POA: Diagnosis not present

## 2021-06-06 DIAGNOSIS — I6522 Occlusion and stenosis of left carotid artery: Secondary | ICD-10-CM | POA: Diagnosis not present

## 2021-06-06 DIAGNOSIS — M316 Other giant cell arteritis: Secondary | ICD-10-CM | POA: Insufficient documentation

## 2021-06-06 HISTORY — PX: ARTERY BIOPSY: SHX891

## 2021-06-06 LAB — POCT I-STAT, CHEM 8
BUN: 17 mg/dL (ref 8–23)
Calcium, Ion: 1.11 mmol/L — ABNORMAL LOW (ref 1.15–1.40)
Chloride: 104 mmol/L (ref 98–111)
Creatinine, Ser: 0.4 mg/dL — ABNORMAL LOW (ref 0.44–1.00)
Glucose, Bld: 87 mg/dL (ref 70–99)
HCT: 36 % (ref 36.0–46.0)
Hemoglobin: 12.2 g/dL (ref 12.0–15.0)
Potassium: 3.7 mmol/L (ref 3.5–5.1)
Sodium: 140 mmol/L (ref 135–145)
TCO2: 26 mmol/L (ref 22–32)

## 2021-06-06 SURGERY — BIOPSY TEMPORAL ARTERY
Anesthesia: Monitor Anesthesia Care | Laterality: Left

## 2021-06-06 MED ORDER — LIDOCAINE HCL (CARDIAC) PF 100 MG/5ML IV SOSY
PREFILLED_SYRINGE | INTRAVENOUS | Status: DC | PRN
  Administered 2021-06-06: 50 mg via INTRATRACHEAL

## 2021-06-06 MED ORDER — LIDOCAINE HCL 1 % IJ SOLN
INTRAMUSCULAR | Status: AC
  Filled 2021-06-06: qty 20

## 2021-06-06 MED ORDER — SODIUM CHLORIDE 0.9 % IV SOLN: INTRAVENOUS | Status: DC

## 2021-06-06 MED ORDER — FENTANYL CITRATE (PF) 250 MCG/5ML IJ SOLN
INTRAMUSCULAR | Status: AC
  Filled 2021-06-06: qty 5

## 2021-06-06 MED ORDER — LACTATED RINGERS IV SOLN: INTRAVENOUS | Status: DC

## 2021-06-06 MED ORDER — ORAL CARE MOUTH RINSE: 15.0000 mL | Freq: Once | OROMUCOSAL | Status: AC

## 2021-06-06 MED ORDER — PROPOFOL 10 MG/ML IV BOLUS
INTRAVENOUS | Status: AC
  Filled 2021-06-06: qty 20

## 2021-06-06 MED ORDER — OXYCODONE-ACETAMINOPHEN 5-325 MG PO TABS: 1.0000 | ORAL_TABLET | Freq: Four times a day (QID) | ORAL | 0 refills | Status: DC | PRN

## 2021-06-06 MED ORDER — MIDAZOLAM HCL 2 MG/2ML IJ SOLN
INTRAMUSCULAR | Status: DC | PRN
  Administered 2021-06-06: 2 mg via INTRAVENOUS

## 2021-06-06 MED ORDER — CEFAZOLIN SODIUM-DEXTROSE 2-4 GM/100ML-% IV SOLN
2.0000 g | INTRAVENOUS | Status: AC
  Administered 2021-06-06: 2 g via INTRAVENOUS

## 2021-06-06 MED ORDER — MIDAZOLAM HCL 2 MG/2ML IJ SOLN
INTRAMUSCULAR | Status: AC
  Filled 2021-06-06: qty 2

## 2021-06-06 MED ORDER — LIDOCAINE HCL (PF) 1 % IJ SOLN
INTRAMUSCULAR | Status: DC | PRN
  Administered 2021-06-06: 6 mL

## 2021-06-06 MED ORDER — CHLORHEXIDINE GLUCONATE 4 % EX LIQD: 60.0000 mL | Freq: Once | CUTANEOUS | Status: DC

## 2021-06-06 MED ORDER — CHLORHEXIDINE GLUCONATE 0.12 % MT SOLN
OROMUCOSAL | Status: AC
  Administered 2021-06-06: 15 mL via OROMUCOSAL
  Filled 2021-06-06: qty 15

## 2021-06-06 MED ORDER — CHLORHEXIDINE GLUCONATE 0.12 % MT SOLN: 15.0000 mL | Freq: Once | OROMUCOSAL | Status: AC

## 2021-06-06 MED ORDER — 0.9 % SODIUM CHLORIDE (POUR BTL) OPTIME
TOPICAL | Status: DC | PRN
  Administered 2021-06-06: 1000 mL

## 2021-06-06 MED ORDER — CEFAZOLIN SODIUM-DEXTROSE 2-4 GM/100ML-% IV SOLN
INTRAVENOUS | Status: AC
  Filled 2021-06-06: qty 100

## 2021-06-06 MED ORDER — LACTATED RINGERS IV SOLN: INTRAVENOUS | Status: DC | PRN

## 2021-06-06 MED ORDER — PROPOFOL 10 MG/ML IV BOLUS
INTRAVENOUS | Status: DC | PRN
  Administered 2021-06-06 (×2): 30 mg via INTRAVENOUS
  Administered 2021-06-06: 20 mg via INTRAVENOUS

## 2021-06-06 SURGICAL SUPPLY — 46 items
ADH SKN CLS APL DERMABOND .7 (GAUZE/BANDAGES/DRESSINGS) ×1
BAG COUNTER SPONGE SURGICOUNT (BAG) ×2 IMPLANT
BAG SPNG CNTER NS LX DISP (BAG) ×1
BALL CTTN LRG ABS STRL LF (GAUZE/BANDAGES/DRESSINGS) ×1
CANISTER SUCT 3000ML PPV (MISCELLANEOUS) ×2 IMPLANT
CLIP LIGATING EXTRA MED SLVR (CLIP) ×2 IMPLANT
CLIP LIGATING EXTRA SM BLUE (MISCELLANEOUS) ×2 IMPLANT
CLIP VESOCCLUDE SM WIDE 6/CT (CLIP) ×3 IMPLANT
CNTNR URN SCR LID CUP LEK RST (MISCELLANEOUS) ×1 IMPLANT
CONT SPEC 4OZ STRL OR WHT (MISCELLANEOUS) ×2
COTTONBALL LRG STERILE PKG (GAUZE/BANDAGES/DRESSINGS) ×2 IMPLANT
COVER SURGICAL LIGHT HANDLE (MISCELLANEOUS) ×2 IMPLANT
DECANTER SPIKE VIAL GLASS SM (MISCELLANEOUS) ×2 IMPLANT
DERMABOND ADVANCED (GAUZE/BANDAGES/DRESSINGS) ×1
DERMABOND ADVANCED .7 DNX12 (GAUZE/BANDAGES/DRESSINGS) ×1 IMPLANT
DRAPE OPHTHALMIC 77X100 STRL (CUSTOM PROCEDURE TRAY) ×2 IMPLANT
ELECT NDL BLADE 2-5/6 (NEEDLE) ×1 IMPLANT
ELECT NEEDLE BLADE 2-5/6 (NEEDLE) ×2 IMPLANT
ELECT REM PT RETURN 9FT ADLT (ELECTROSURGICAL) ×2
ELECTRODE REM PT RTRN 9FT ADLT (ELECTROSURGICAL) ×1 IMPLANT
GAUZE 4X4 16PLY ~~LOC~~+RFID DBL (SPONGE) ×2 IMPLANT
GEL ULTRASOUND 8.5O AQUASONIC (MISCELLANEOUS) ×2 IMPLANT
GLOVE SURG ENC MOIS LTX SZ7.5 (GLOVE) ×2 IMPLANT
GOWN STRL REUS W/ TWL LRG LVL3 (GOWN DISPOSABLE) ×1 IMPLANT
GOWN STRL REUS W/ TWL XL LVL3 (GOWN DISPOSABLE) ×1 IMPLANT
GOWN STRL REUS W/TWL LRG LVL3 (GOWN DISPOSABLE) ×2
GOWN STRL REUS W/TWL XL LVL3 (GOWN DISPOSABLE) ×2
KIT BASIN OR (CUSTOM PROCEDURE TRAY) ×2 IMPLANT
KIT TURNOVER KIT B (KITS) ×2 IMPLANT
LOOP VESSEL MINI RED (MISCELLANEOUS) ×2 IMPLANT
NDL HYPO 25GX1X1/2 BEV (NEEDLE) ×1 IMPLANT
NEEDLE HYPO 25GX1X1/2 BEV (NEEDLE) ×2 IMPLANT
NS IRRIG 1000ML POUR BTL (IV SOLUTION) ×2 IMPLANT
PACK GENERAL/GYN (CUSTOM PROCEDURE TRAY) ×2 IMPLANT
PAD ARMBOARD 7.5X6 YLW CONV (MISCELLANEOUS) ×4 IMPLANT
SUCTION FRAZIER HANDLE 10FR (MISCELLANEOUS) ×2
SUCTION TUBE FRAZIER 10FR DISP (MISCELLANEOUS) ×1 IMPLANT
SUT MNCRL AB 4-0 PS2 18 (SUTURE) ×2 IMPLANT
SUT PROLENE 6 0 BV (SUTURE) IMPLANT
SUT SILK 3 0 (SUTURE)
SUT SILK 3-0 18XBRD TIE 12 (SUTURE) IMPLANT
SUT VIC AB 3-0 SH 27 (SUTURE) ×2
SUT VIC AB 3-0 SH 27X BRD (SUTURE) ×1 IMPLANT
SYR CONTROL 10ML LL (SYRINGE) ×2 IMPLANT
TOWEL GREEN STERILE (TOWEL DISPOSABLE) ×2 IMPLANT
WATER STERILE IRR 1000ML POUR (IV SOLUTION) ×2 IMPLANT

## 2021-06-06 NOTE — Anesthesia Postprocedure Evaluation (Signed)
Anesthesia Post Note  Patient: Tiffany Nguyen  Procedure(s) Performed: LEFT TEMPORAL ARTERY BIOPSY (Left)     Patient location during evaluation: PACU Level of consciousness: awake Pain management: pain level controlled Respiratory status: spontaneous breathing Cardiovascular status: stable Postop Assessment: no apparent nausea or vomiting Anesthetic complications: no   No notable events documented.  Last Vitals:  Vitals:   06/06/21 1045 06/06/21 1100  BP: 115/60 (!) 112/57  Pulse: 64 61  Resp: 19 19  Temp:  36.6 C  SpO2: 99% 100%    Last Pain:  Vitals:   06/06/21 1100  TempSrc:   PainSc: 0-No pain                 Nabeeha Badertscher

## 2021-06-06 NOTE — Interval H&P Note (Signed)
History and Physical Interval Note:  06/06/2021 8:55 AM  Tiffany Nguyen  has presented today for surgery, with the diagnosis of Bilateral Temporal Arteritis.  The various methods of treatment have been discussed with the patient and family. After consideration of risks, benefits and other options for treatment, the patient has consented to  Procedure(s): BILATERAL TEMPORAL ARTERY BIOPSY (Bilateral) as a surgical intervention.  We have discussed proceeding with only the left temporal artery biopsy today.  The patient's history has been reviewed, patient examined, no change in status, stable for surgery.  I have reviewed the patient's chart and labs.  Questions were answered to the patient's satisfaction.     Servando Snare

## 2021-06-06 NOTE — Anesthesia Procedure Notes (Signed)
Procedure Name: MAC Date/Time: 06/06/2021 9:27 AM Performed by: Claris Che, CRNA Pre-anesthesia Checklist: Patient identified, Emergency Drugs available, Suction available, Patient being monitored and Timeout performed Patient Re-evaluated:Patient Re-evaluated prior to induction Oxygen Delivery Method: Simple face mask Dental Injury: Teeth and Oropharynx as per pre-operative assessment

## 2021-06-06 NOTE — Anesthesia Preprocedure Evaluation (Signed)
Anesthesia Evaluation  Patient identified by MRN, date of birth, ID band Patient awake    Reviewed: Allergy & Precautions, NPO status , Patient's Chart, lab work & pertinent test results  Airway Mallampati: II  TM Distance: >3 FB     Dental   Pulmonary    breath sounds clear to auscultation       Cardiovascular  Rhythm:Regular Rate:Normal     Neuro/Psych  Headaches,    GI/Hepatic negative GI ROS, Neg liver ROS,   Endo/Other  negative endocrine ROS  Renal/GU negative Renal ROS     Musculoskeletal   Abdominal   Peds  Hematology   Anesthesia Other Findings   Reproductive/Obstetrics                             Anesthesia Physical Anesthesia Plan  ASA: 3  Anesthesia Plan:    Post-op Pain Management:    Induction: Intravenous  PONV Risk Score and Plan: Ondansetron, Dexamethasone and Midazolam  Airway Management Planned: Nasal Cannula and Simple Face Mask  Additional Equipment:   Intra-op Plan:   Post-operative Plan:   Informed Consent: I have reviewed the patients History and Physical, chart, labs and discussed the procedure including the risks, benefits and alternatives for the proposed anesthesia with the patient or authorized representative who has indicated his/her understanding and acceptance.     Dental advisory given  Plan Discussed with: CRNA and Anesthesiologist  Anesthesia Plan Comments:         Anesthesia Quick Evaluation

## 2021-06-06 NOTE — Discharge Instructions (Signed)
Temporal Artery Biopsy, Care After This sheet gives you information about how to care for yourself after your procedure. Your health care provider may also give you more specific instructions. If you have problems or questions, contact your health careprovider. What can I expect after the procedure? After the procedure, it is common to have: Soreness. Bruising. Numbness. Swelling. Follow these instructions at home: Incision care  Follow instructions from your health care provider about how to take care of your incision. Make sure you: Wash your hands with soap and water before and after you change your bandage (dressing). If soap and water are not available, use hand sanitizer. Change your dressing as told by your health care provider. Leave stitches (sutures), skin glue, or adhesive strips in place. These skin closures may need to stay in place for 2 weeks or longer. If adhesive strip edges start to loosen and curl up, you may trim the loose edges. Do not remove adhesive strips completely unless your health care provider tells you to do that. Check your incision area every day for signs of infection. Check for: Redness, swelling, or pain. Fluid or blood. Warmth. Pus or a bad smell.  Activity Do not do any physical work or strenuous exercise until your health care provider approves. Do not lift anything that is heavier than 10 lb (4.5 kg), or the limit that you are told, until your health care provider says that it is safe. General instructions  Take over-the-counter and prescription medicines only as told by your health care provider. Do not take aspirin or other NSAIDs unless your health care provider tells you to take them. Aspirin may increase your risk of bleeding at the incision site. Do not drive for 24 hours if you were given a sedative during your procedure. Follow your health care provider's instructions on bathing or showering. Keep all follow-up visits as told by your health  care provider. This is important.  Contact a health care provider if: Medicine does not help your pain. You have chills. You have redness, swelling, or pain around your incision site. You have fluid or blood coming from your incision site. Your incision feels warm to the touch. You have pus or a bad smell coming from your incision site. You have a fever. You have nausea or vomiting. Get help right away if: You have bleeding from the incision that does not stop after 30 minutes of applying heavy pressure. You have chest pain. You have shortness of breath. You faint. You have sudden vision loss. You have weakness or drooping in your face or eye. Summary Follow instructions from your health care provider about how to take care of your incision. Do not do any physical work or strenuous exercise until your health care provider approves. Take over-the-counter and prescription medicines only as told by your health care provider. Contact a health care provider if you have signs of infection at your incision. Keep all follow-up visits as told by your health care provider. This is important. This information is not intended to replace advice given to you by your health care provider. Make sure you discuss any questions you have with your healthcare provider. Document Revised: 07/23/2018 Document Reviewed: 07/23/2018 Elsevier Patient Education  2022 [employer].

## 2021-06-06 NOTE — Transfer of Care (Signed)
Immediate Anesthesia Transfer of Care Note  Patient: Tiffany Nguyen  Procedure(s) Performed: LEFT TEMPORAL ARTERY BIOPSY (Left)  Patient Location: PACU  Anesthesia Type:MAC  Level of Consciousness: awake, alert , oriented and patient cooperative  Airway & Oxygen Therapy: Patient Spontanous Breathing  Post-op Assessment: Report given to RN, Post -op Vital signs reviewed and stable and Patient moving all extremities X 4  Post vital signs: Reviewed and stable  Last Vitals:  Vitals Value Taken Time  BP 105/63 06/06/21 1028  Temp    Pulse 65 06/06/21 1029  Resp 16 06/06/21 1029  SpO2 100 % 06/06/21 1029  Vitals shown include unvalidated device data.  Last Pain:  Vitals:   06/06/21 0835  TempSrc:   PainSc: 0-No pain         Complications: No notable events documented.

## 2021-06-06 NOTE — Op Note (Signed)
    Patient name: Tiffany Nguyen MRN: [id number] DOB: 02-13-1946 Sex: female  06/06/2021 Pre-operative Diagnosis: Concern for giant cell arteritis Post-operative diagnosis:  Same Surgeon:  Erlene Quan C. Donzetta Matters, MD Assistant: Shea Evans, MS 3 Procedure Performed:  Left temporal artery biopsy  Indications: 75 year old female with bilateral headaches left greater than right.  She also has tenderness over her left temple.  She is indicated for left temporal artery biopsy.  Primary care Dr. Bevelyn Buckles.  Findings: Temporal artery was identified.  It did appear significantly thickened.  At completion there was no Doppler signal in the wound bed.   Procedure:  The patient was identified in the holding area and taken to the operating where she is placed supine on upper table and MAC anesthesia was induced.  She was sterilely prepped draped in the left side of her face, antibiotics were ministered timeout was called.  Doppler was used to identify the signal in the temporal artery.  This was marked on the skin.  The area was anesthetized 1% lidocaine.  The incision was traced with a 15 blade.  We dissected down we divided many branches veins between clips.  The artery at this time did not have a signal or pulse.  We dissected out superiorly until it branched these branches were both clipped.  Was transected distally.  We dissected back dividing small branches between clips.  We got approximately reclamped it.  We transected it with approximately 5 mm still exposed.  I allowed it to bleed and had very little lumen which I did dilate with a 2 mm lumen and then had some pulsatile bleeding.  There was no Doppler signal in the artery.  We tied off the proximal aspect with 2-0 silk suture.  We irrigated the wound obtaining the stasis and closed the skin with 4 Monocryl.  Dermabond was applied to the skin.  She tolerated procedure without any complication.  Counts were correct at completion.  Specimen: Left  temporal artery  EBL: 10 cc   Annslee Tercero C. Donzetta Matters, MD [facility] Office: [phone] Pager: [phone]

## 2021-06-07 ENCOUNTER — Encounter (HOSPITAL_COMMUNITY): Payer: Self-pay | Admitting: Vascular Surgery

## 2021-06-07 LAB — SURGICAL PATHOLOGY

## 2021-06-22 DIAGNOSIS — D649 Anemia, unspecified: Secondary | ICD-10-CM | POA: Diagnosis not present

## 2021-06-22 DIAGNOSIS — M316 Other giant cell arteritis: Secondary | ICD-10-CM | POA: Diagnosis not present

## 2021-06-22 DIAGNOSIS — M810 Age-related osteoporosis without current pathological fracture: Secondary | ICD-10-CM | POA: Diagnosis not present

## 2021-07-04 ENCOUNTER — Ambulatory Visit (HOSPITAL_BASED_OUTPATIENT_CLINIC_OR_DEPARTMENT_OTHER): Payer: PPO | Admitting: Obstetrics & Gynecology

## 2021-07-11 ENCOUNTER — Encounter: Payer: Self-pay | Admitting: Gastroenterology

## 2021-07-11 ENCOUNTER — Ambulatory Visit: Payer: PPO | Admitting: Gastroenterology

## 2021-07-11 VITALS — BP 104/64 | HR 76 | Ht 64.0 in | Wt 91.6 lb

## 2021-07-11 DIAGNOSIS — Z86010 Personal history of colonic polyps: Secondary | ICD-10-CM

## 2021-07-11 DIAGNOSIS — R634 Abnormal weight loss: Secondary | ICD-10-CM

## 2021-07-11 DIAGNOSIS — R1310 Dysphagia, unspecified: Secondary | ICD-10-CM

## 2021-07-11 NOTE — Progress Notes (Signed)
History of Present Illness: This is a 75 year old female referred by Leanna Battles, MD for the evaluation of dysphagia, weight loss.  She is accompanied by her husband.  She relates a gradual weight loss over many months.  She notes the onset of dysphagia several weeks ago which has recently improved.  She was diagnosed with temporal arteritis and is started prednisone.  Except for some difficulty with dry meats she has not had any dysphagia since beginning prednisone.  She has begun to gain weight.  Barium esophagram as below. Denies  abdominal pain, constipation, diarrhea, change in stool caliber, melena, hematochezia, nausea, vomiting, reflux symptoms, chest pain.    Barium esophagram 05/19/2021 Mild esophageal dysmotility.  Otherwise normal esophagram.    Allergies  Allergen Reactions   Hydrocodone-Ibuprofen Nausea And Vomiting   Guaifenesin Nausea Only   Outpatient Medications Prior to Visit  Medication Sig Dispense Refill   alendronate (FOSAMAX) 70 MG tablet Take 70 mg by mouth once a week.     aspirin 81 MG tablet Take 81 mg by mouth daily.     calcium carbonate (OS-CAL - DOSED IN MG OF ELEMENTAL CALCIUM) 1250 (500 Ca) MG tablet Take 1 tablet by mouth daily with breakfast.     Cholecalciferol (VITAMIN D) 50 MCG (2000 UT) CAPS Take 2,000 Units by mouth daily.     cyanocobalamin 500 MCG tablet Take 500 mcg by mouth daily. Vitamin B12.     PEDIATRIC VITAMINS ACD-IRON PO Take 15 mg by mouth daily.     predniSONE (DELTASONE) 20 MG tablet Take 30 mg by mouth 2 (two) times daily with a meal.     simvastatin (ZOCOR) 40 MG tablet Take 40 mg by mouth every evening.     triamcinolone (KENALOG) 0.025 % ointment Apply 1 application topically 2 (two) times daily. Do not use for more than 7 days. (Patient taking differently: Apply 1 application topically daily as needed ([place] area).) 30 g 1   oxyCODONE-acetaminophen (PERCOCET) 5-325 MG tablet Take 1 tablet by mouth every 6 (six) hours as  needed for severe pain. 4 tablet 0   No facility-administered medications prior to visit.   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   Tubular adenoma of colon 05/2014   Past Surgical History:  Procedure Laterality Date   APPENDECTOMY  01/08/2002   ARTERY BIOPSY Left 06/06/2021   Procedure: LEFT TEMPORAL ARTERY BIOPSY;  Surgeon: Waynetta Sandy, MD;  Location: [facility];  Service: Vascular;  Laterality: Left;   [place]  06/2007   TUBAL LIGATION  1983   Social History   Socioeconomic History   Marital status: Married    Spouse name: Not on file   Number of children: 1   Years of education: Not on file   Highest education level: Not on file  Occupational History    Employer: [employer]  Tobacco Use   Smoking status: Never   Smokeless tobacco: Never  Vaping Use   Vaping Use: Never used  Substance and Sexual Activity   Alcohol use:  No   Drug use: No   Sexual activity: Not Currently    Partners: Male    Birth control/protection: Post-menopausal  Other Topics Concern   Not on file  Social History Narrative   Not on file   Social Determinants of Health   Financial Resource Strain: Not on file  Food Insecurity: Not on file  Transportation Needs: Not on file  Physical Activity: Not on file  Stress: Not on file  Social Connections: Not on file   Family History  Problem Relation Age of Onset   COPD Father    Hypertension Mother    Pulmonary embolism Mother    COPD Brother        smoker       Review of Systems: Pertinent positive and negative review of systems were noted in the above HPI  section. All other review of systems were otherwise negative.    Physical Exam: General: Well developed, well nourished, no acute distress Head: Normocephalic and atraumatic Eyes: Sclerae anicteric, EOMI Ears: Normal auditory acuity Mouth: Not examined, mask on during Covid-19 pandemic Neck: Supple, no masses or thyromegaly Lungs: Clear throughout to auscultation Heart: Regular rate and rhythm; no murmurs, rubs or bruits Abdomen: Soft, non tender and non distended. No masses, hepatosplenomegaly or hernias noted. Normal Bowel sounds Rectal: Deferred to colonoscopy  Musculoskeletal: Symmetrical with no gross deformities  Skin: No lesions on visible extremities Pulses:  Normal pulses noted Extremities: No clubbing, cyanosis, edema or deformities noted Neurological: Alert oriented x 4, grossly nonfocal Cervical Nodes:  No significant cervical adenopathy Inguinal Nodes: No significant inguinal adenopathy Psychological:  Alert and cooperative. Normal mood and affect   Assessment and Recommendations:  Dysphagia. Weight loss.  Barium esophagram shows mild dysmotility which does correlate with the severity of her dysphagia.  Advised proceeding with EGD for further evaluation however she wants to defer EGD until her temporal arteritis is under better control.  In addition she relates that her daughter is undergoing surgery at [facility] this Friday and she would like ample time following her daughters surgery to assist her daughter before undergoing further GI evaluation.  Advised taking Fosamax with at least 8 ounces of water and remaining in the fully upright position for 3 hours after taking Fosamax.  If her dysphagia worsens again she is advised to call for further advice. REV in 6 to 8 weeks to reevaluate and hopefully she will be amenable to scheduling EGD at that time. Personal history of adenomatous colon polyps due for surveillance colonoscopy.  Discussed proceeding with surveillance colonoscopy  at the time of her EGD and she is agreeable to schedule following her REV.    cc: Leanna Battles, MD [address] [place],  [place] [address]

## 2021-07-11 NOTE — Patient Instructions (Signed)
Please follow up with Dr. Fuller Plan in 6-8 weeks.  If your symptoms persist and want to schedule sooner please contact our office.   The [facility] providers would like to encourage you to use [facility] to communicate with providers for non-urgent requests or questions.  Due to long hold times on the telephone, sending your provider a message by [facility] may be a faster and more efficient way to get a response.  Please allow 48 business hours for a response.  Please remember that this is for non-urgent requests.   Thank you for choosing me and [facility].  Pricilla Riffle. Dagoberto Ligas., MD., [facility]

## 2021-07-12 DIAGNOSIS — E611 Iron deficiency: Secondary | ICD-10-CM | POA: Diagnosis not present

## 2021-07-12 DIAGNOSIS — M316 Other giant cell arteritis: Secondary | ICD-10-CM | POA: Diagnosis not present

## 2021-07-12 DIAGNOSIS — M810 Age-related osteoporosis without current pathological fracture: Secondary | ICD-10-CM | POA: Diagnosis not present

## 2021-07-12 DIAGNOSIS — Z681 Body mass index (BMI) 19 or less, adult: Secondary | ICD-10-CM | POA: Diagnosis not present

## 2021-07-12 DIAGNOSIS — Z7952 Long term (current) use of systemic steroids: Secondary | ICD-10-CM | POA: Diagnosis not present

## 2021-07-13 ENCOUNTER — Other Ambulatory Visit: Payer: Self-pay | Admitting: Rheumatology

## 2021-07-13 DIAGNOSIS — M316 Other giant cell arteritis: Secondary | ICD-10-CM

## 2021-08-03 DIAGNOSIS — I781 Nevus, non-neoplastic: Secondary | ICD-10-CM | POA: Diagnosis not present

## 2021-08-03 DIAGNOSIS — R21 Rash and other nonspecific skin eruption: Secondary | ICD-10-CM | POA: Diagnosis not present

## 2021-08-03 DIAGNOSIS — L909 Atrophic disorder of skin, unspecified: Secondary | ICD-10-CM | POA: Diagnosis not present

## 2021-08-04 ENCOUNTER — Other Ambulatory Visit: Payer: Self-pay | Admitting: Rheumatology

## 2021-08-04 DIAGNOSIS — I776 Arteritis, unspecified: Secondary | ICD-10-CM

## 2021-08-08 ENCOUNTER — Ambulatory Visit
Admission: RE | Admit: 2021-08-08 | Discharge: 2021-08-08 | Disposition: A | Discharge status: Home / Self Care | Payer: PPO | Source: Ambulatory Visit | Attending: Rheumatology | Admitting: Rheumatology

## 2021-08-08 DIAGNOSIS — I2510 Atherosclerotic heart disease of native coronary artery without angina pectoris: Secondary | ICD-10-CM | POA: Diagnosis not present

## 2021-08-08 DIAGNOSIS — I700 Atherosclerosis of aorta: Secondary | ICD-10-CM | POA: Diagnosis not present

## 2021-08-08 DIAGNOSIS — R918 Other nonspecific abnormal finding of lung field: Secondary | ICD-10-CM | POA: Diagnosis not present

## 2021-08-08 DIAGNOSIS — I776 Arteritis, unspecified: Secondary | ICD-10-CM

## 2021-08-08 DIAGNOSIS — J984 Other disorders of lung: Secondary | ICD-10-CM | POA: Diagnosis not present

## 2021-08-08 MED ORDER — IOPAMIDOL (ISOVUE-370) INJECTION 76%
75.0000 mL | Freq: Once | INTRAVENOUS | Status: AC | PRN
  Administered 2021-08-08: 75 mL via INTRAVENOUS

## 2021-08-10 ENCOUNTER — Ambulatory Visit (INDEPENDENT_AMBULATORY_CARE_PROVIDER_SITE_OTHER): Payer: PPO | Admitting: Obstetrics & Gynecology

## 2021-08-10 ENCOUNTER — Other Ambulatory Visit: Payer: Self-pay

## 2021-08-10 DIAGNOSIS — R634 Abnormal weight loss: Secondary | ICD-10-CM

## 2021-08-10 NOTE — Progress Notes (Signed)
GYNECOLOGY  VISIT  CC:   weight loss  HPI: 75 y.o. G69P1001 Married White or Caucasian female here for gyn exam.  She is not sure she needs this so wanted to talk first.  Pt has obviously lost weight since I saw her last.  She has been diagnosed with temporal arteritis and is on prednisone.  Weight loss is common with this diagnosis but she has lost 19 pounds since she was here last.  We reviewed work up and evaluation needed for unintended weight loss.  Pt has CT of chest yesterday.  Reviewed findings.  89mm nodule present.  She has not been called about this yet.  Possible follow up discussed.  Pt does have appt with PCP next week for lab work.  Typically goes in for blood work before having appointment. Asked that she call and advise of significant weight loss for some additional blood work that hopefully will be added.  Pt's last pap smear was 05/2019.  Denies vaginal bleeding.  Additional pap smear screening not indicated at this time.  Recommended pelvic exam to exam ovaries and ensure normal due to weight loss.  Pt comfortable with this.    MMG normal 04/03/2021  Last colonoscoyp 2015.  Has adenomatous polyp.  Follow up 5 years recommended.  Pt is aware this is overdue.  Just feels she couldn't handle doing the prep right now.  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   Occlusion and stenosis of carotid artery without mention of cerebral infarction 01/30/2012    Past Medical History:  Diagnosis Date   Anemia 01/25/2012   Slight   Breast abscess 2005   Carotid artery occlusion    right   Cerebrovascular disease    Gastric polyp 2011   hyperplastic   Headache    Hyperlipidemia    Lichen sclerosus et atrophicus of the vulva biopsy proven 08/11/2010   Osteoporosis    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     aspirin 81 MG tablet Take 81 mg by mouth daily.     calcium carbonate (OS-CAL - DOSED IN MG OF ELEMENTAL CALCIUM) 1250 (500 Ca) MG tablet Take 1 tablet by mouth daily with breakfast.     Cholecalciferol (VITAMIN D) 50 MCG (2000 UT) CAPS Take 2,000 Units by mouth daily.     cyanocobalamin 500 MCG tablet Take 500 mcg by mouth daily. Vitamin B12.     PEDIATRIC VITAMINS ACD-IRON PO Take 15 mg by mouth daily.     predniSONE (DELTASONE) 20 MG tablet Take 30 mg by mouth 2 (two) times daily with a meal.     simvastatin (ZOCOR) 40 MG tablet Take 40 mg by mouth every evening.     triamcinolone (  KENALOG) 0.025 % ointment Apply 1 application topically 2 (two) times daily. Do not use for more than 7 days. (Patient taking differently: Apply 1 application topically daily as needed ([place] area).) 30 g 1   No current facility-administered medications on file prior to visit.    ALLERGIES: Hydrocodone-ibuprofen and Guaifenesin  Family History  Problem Relation Age of Onset   COPD Father    Hypertension Mother    Pulmonary embolism Mother    COPD Brother        smoker    SH:  married, non smoker  Review of Systems  Constitutional:  Positive for unexpected weight change.  Gastrointestinal: Negative.   Genitourinary: Negative.   Psychiatric/Behavioral: Negative.     PHYSICAL EXAMINATION:    LMP 11/12/1996 (Approximate)     General appearance: alert,  cooperative and appears stated age Abdomen: soft, non-tender; bowel sounds normal; no masses,  no organomegaly Lymph:  no inguinal LAD noted  Pelvic: External genitalia:  no lesions              Urethra:  normal appearing urethra with no masses, tenderness or lesions              Bartholins and Skenes: normal                 Vagina: normal appearing vagina with normal color and discharge, no lesions              Cervix: no lesions              Bimanual Exam:  Uterus:  normal size, contour, position, consistency, mobility, non-tender              Adnexa: no mass, fullness, tenderness              Rectovaginal: Yes.  .  Confirms.              Anus:  normal sphincter tone, no lesions  Chaperone, Octaviano Batty, CMA, was present for exam.  Assessment/Plan: 1. Weight loss - work up reviewed.  Exam normal today.  She is going to follow up with Dr. Philip Aspen about lab work being done next week.  Encouraged pt to proceed with colonoscopy when possible.  Pt is going to follow up prn.  Aware pap smears not indicated.    Total time with pt and documentation: 52minutes

## 2021-08-11 ENCOUNTER — Encounter (HOSPITAL_BASED_OUTPATIENT_CLINIC_OR_DEPARTMENT_OTHER): Payer: Self-pay | Admitting: Obstetrics & Gynecology

## 2021-08-13 ENCOUNTER — Encounter: Payer: Self-pay | Admitting: Gastroenterology

## 2021-08-14 DIAGNOSIS — E785 Hyperlipidemia, unspecified: Secondary | ICD-10-CM | POA: Diagnosis not present

## 2021-08-14 DIAGNOSIS — D649 Anemia, unspecified: Secondary | ICD-10-CM | POA: Diagnosis not present

## 2021-08-14 DIAGNOSIS — M810 Age-related osteoporosis without current pathological fracture: Secondary | ICD-10-CM | POA: Diagnosis not present

## 2021-08-15 DIAGNOSIS — M316 Other giant cell arteritis: Secondary | ICD-10-CM | POA: Diagnosis not present

## 2021-08-15 DIAGNOSIS — R1311 Dysphagia, oral phase: Secondary | ICD-10-CM | POA: Diagnosis not present

## 2021-08-15 DIAGNOSIS — Z681 Body mass index (BMI) 19 or less, adult: Secondary | ICD-10-CM | POA: Diagnosis not present

## 2021-08-15 DIAGNOSIS — Z7952 Long term (current) use of systemic steroids: Secondary | ICD-10-CM | POA: Diagnosis not present

## 2021-08-15 DIAGNOSIS — M810 Age-related osteoporosis without current pathological fracture: Secondary | ICD-10-CM | POA: Diagnosis not present

## 2021-08-17 DIAGNOSIS — D692 Other nonthrombocytopenic purpura: Secondary | ICD-10-CM | POA: Diagnosis not present

## 2021-08-17 DIAGNOSIS — L573 Poikiloderma of Civatte: Secondary | ICD-10-CM | POA: Diagnosis not present

## 2021-08-21 DIAGNOSIS — K224 Dyskinesia of esophagus: Secondary | ICD-10-CM | POA: Diagnosis not present

## 2021-08-21 DIAGNOSIS — M810 Age-related osteoporosis without current pathological fracture: Secondary | ICD-10-CM | POA: Diagnosis not present

## 2021-08-21 DIAGNOSIS — Z1212 Encounter for screening for malignant neoplasm of rectum: Secondary | ICD-10-CM | POA: Diagnosis not present

## 2021-08-21 DIAGNOSIS — M316 Other giant cell arteritis: Secondary | ICD-10-CM | POA: Diagnosis not present

## 2021-08-21 DIAGNOSIS — E785 Hyperlipidemia, unspecified: Secondary | ICD-10-CM | POA: Diagnosis not present

## 2021-08-21 DIAGNOSIS — Z23 Encounter for immunization: Secondary | ICD-10-CM | POA: Diagnosis not present

## 2021-08-21 DIAGNOSIS — R634 Abnormal weight loss: Secondary | ICD-10-CM | POA: Diagnosis not present

## 2021-08-21 DIAGNOSIS — Z0000 Encounter for general adult medical examination without abnormal findings: Secondary | ICD-10-CM | POA: Diagnosis not present

## 2021-08-21 DIAGNOSIS — R2681 Unsteadiness on feet: Secondary | ICD-10-CM | POA: Diagnosis not present

## 2021-08-21 DIAGNOSIS — R82998 Other abnormal findings in urine: Secondary | ICD-10-CM | POA: Diagnosis not present

## 2021-08-21 DIAGNOSIS — I679 Cerebrovascular disease, unspecified: Secondary | ICD-10-CM | POA: Diagnosis not present

## 2021-08-24 ENCOUNTER — Ambulatory Visit: Payer: PPO | Admitting: Gastroenterology

## 2021-09-20 DIAGNOSIS — Z7952 Long term (current) use of systemic steroids: Secondary | ICD-10-CM | POA: Diagnosis not present

## 2021-09-20 DIAGNOSIS — R531 Weakness: Secondary | ICD-10-CM | POA: Diagnosis not present

## 2021-09-20 DIAGNOSIS — M810 Age-related osteoporosis without current pathological fracture: Secondary | ICD-10-CM | POA: Diagnosis not present

## 2021-09-20 DIAGNOSIS — M316 Other giant cell arteritis: Secondary | ICD-10-CM | POA: Diagnosis not present

## 2021-09-20 DIAGNOSIS — Z681 Body mass index (BMI) 19 or less, adult: Secondary | ICD-10-CM | POA: Diagnosis not present

## 2021-09-20 DIAGNOSIS — G720 Drug-induced myopathy: Secondary | ICD-10-CM | POA: Diagnosis not present

## 2021-09-21 DIAGNOSIS — L270 Generalized skin eruption due to drugs and medicaments taken internally: Secondary | ICD-10-CM | POA: Diagnosis not present

## 2021-09-21 DIAGNOSIS — L650 Telogen effluvium: Secondary | ICD-10-CM | POA: Diagnosis not present

## 2021-09-25 DIAGNOSIS — Z23 Encounter for immunization: Secondary | ICD-10-CM | POA: Diagnosis not present

## 2021-09-26 ENCOUNTER — Ambulatory Visit: Payer: PPO | Admitting: Gastroenterology

## 2021-10-12 DIAGNOSIS — D692 Other nonthrombocytopenic purpura: Secondary | ICD-10-CM | POA: Diagnosis not present

## 2021-10-12 DIAGNOSIS — L718 Other rosacea: Secondary | ICD-10-CM | POA: Diagnosis not present

## 2021-10-12 DIAGNOSIS — I788 Other diseases of capillaries: Secondary | ICD-10-CM | POA: Diagnosis not present

## 2021-10-12 DIAGNOSIS — D170 Benign lipomatous neoplasm of skin and subcutaneous tissue of head, face and neck: Secondary | ICD-10-CM | POA: Diagnosis not present

## 2021-10-16 ENCOUNTER — Other Ambulatory Visit: Payer: Self-pay | Admitting: Internal Medicine

## 2021-10-16 ENCOUNTER — Ambulatory Visit
Admission: RE | Admit: 2021-10-16 | Discharge: 2021-10-16 | Disposition: A | Discharge status: Home / Self Care | Payer: PPO | Source: Ambulatory Visit | Attending: Internal Medicine | Admitting: Internal Medicine

## 2021-10-16 DIAGNOSIS — M316 Other giant cell arteritis: Secondary | ICD-10-CM | POA: Diagnosis not present

## 2021-10-16 DIAGNOSIS — M47814 Spondylosis without myelopathy or radiculopathy, thoracic region: Secondary | ICD-10-CM | POA: Diagnosis not present

## 2021-10-16 DIAGNOSIS — I2699 Other pulmonary embolism without acute cor pulmonale: Secondary | ICD-10-CM

## 2021-10-16 DIAGNOSIS — L659 Nonscarring hair loss, unspecified: Secondary | ICD-10-CM | POA: Diagnosis not present

## 2021-10-16 DIAGNOSIS — L989 Disorder of the skin and subcutaneous tissue, unspecified: Secondary | ICD-10-CM | POA: Diagnosis not present

## 2021-10-16 DIAGNOSIS — J984 Other disorders of lung: Secondary | ICD-10-CM | POA: Diagnosis not present

## 2021-10-16 DIAGNOSIS — I700 Atherosclerosis of aorta: Secondary | ICD-10-CM | POA: Diagnosis not present

## 2021-10-16 DIAGNOSIS — I2510 Atherosclerotic heart disease of native coronary artery without angina pectoris: Secondary | ICD-10-CM | POA: Diagnosis not present

## 2021-10-16 MED ORDER — IOPAMIDOL (ISOVUE-370) INJECTION 76%
100.0000 mL | Freq: Once | INTRAVENOUS | Status: AC | PRN
  Administered 2021-10-16: 100 mL via INTRAVENOUS

## 2021-10-25 ENCOUNTER — Encounter: Payer: Self-pay | Admitting: Gastroenterology

## 2021-10-25 ENCOUNTER — Ambulatory Visit: Payer: PPO | Admitting: Gastroenterology

## 2021-10-25 VITALS — BP 98/60 | HR 75 | Ht 64.0 in | Wt 98.8 lb

## 2021-10-25 DIAGNOSIS — R634 Abnormal weight loss: Secondary | ICD-10-CM

## 2021-10-25 DIAGNOSIS — R0600 Dyspnea, unspecified: Secondary | ICD-10-CM

## 2021-10-25 DIAGNOSIS — Z86010 Personal history of colonic polyps: Secondary | ICD-10-CM

## 2021-10-25 NOTE — Patient Instructions (Signed)
If you are age 75 or older, your body mass index should be between 23-30. Your Body mass index is 16.96 kg/m. If this is out of the aforementioned range listed, please consider follow up with your Primary Care Provider.  If you are age 25 or younger, your body mass index should be between 19-25. Your Body mass index is 16.96 kg/m. If this is out of the aformentioned range listed, please consider follow up with your Primary Care Provider.   ________________________________________________________  The [facility] providers would like to encourage you to use [facility] to communicate with providers for non-urgent requests or questions.  Due to long hold times on the telephone, sending your provider a message by [facility] may be a faster and more efficient way to get a response.  Please allow 48 business hours for a response.  Please remember that this is for non-urgent requests.  _______________________________________________________  Recall Colonoscopy in June 2023, please call if you are ready to proceed before this time.  Thank you for choosing me and [facility].  Pricilla Riffle. Dagoberto Ligas., MD., Marval Regal

## 2021-10-25 NOTE — Progress Notes (Signed)
° ° °  History of Present Illness: This is a 75 year old female returning for follow-up of dysphagia and weight loss.  She was diagnosed with temporal arteritis and has been on a prednisone taper which is now been reduced to 50 mg daily.  Her dysphagia has completely resolved.  She has had a 7 pound weight gain since her last office visit.   Current Medications, Allergies, Past Medical History, Past Surgical History, Family History and Social History were reviewed in [employer] record.   Physical Exam: General: Well developed, well nourished, no acute distress Head: Normocephalic and atraumatic Eyes: Sclerae anicteric, EOMI Ears: Normal auditory acuity Neurological: Alert oriented x 4, grossly nonfocal Psychological:  Alert and cooperative. Normal mood and affect   Assessment and Recommendations:  Dysphagia, resolved. Mild esophageal dysmotility. Defer EGD and reconsider if dysphagia returns or other upper GI symptoms develop.  Temporal arteritis improving on Prednisone, currently at 15 mg. Follow up with Dr. Trudie Reed.  She is gaining weight and her appetite has significantly improved.  Defer to PCP and rheumatology to monitor. Personal history of adenomatous colon polyps.  She wishes to defer surveillance colonoscopy for a few months until she makes additional recovery from her temporal arteritis and weight loss.  Advised her to call when she is ready in the spring.  Recall colonoscopy placed for June 2023 if she does not contact us prior to that.

## 2021-11-22 DIAGNOSIS — Z681 Body mass index (BMI) 19 or less, adult: Secondary | ICD-10-CM | POA: Diagnosis not present

## 2021-11-22 DIAGNOSIS — M316 Other giant cell arteritis: Secondary | ICD-10-CM | POA: Diagnosis not present

## 2021-11-22 DIAGNOSIS — M810 Age-related osteoporosis without current pathological fracture: Secondary | ICD-10-CM | POA: Diagnosis not present

## 2021-11-22 DIAGNOSIS — Z7952 Long term (current) use of systemic steroids: Secondary | ICD-10-CM | POA: Diagnosis not present

## 2021-12-19 DIAGNOSIS — S81812A Laceration without foreign body, left lower leg, initial encounter: Secondary | ICD-10-CM | POA: Diagnosis not present

## 2021-12-19 DIAGNOSIS — L03116 Cellulitis of left lower limb: Secondary | ICD-10-CM | POA: Diagnosis not present

## 2021-12-19 DIAGNOSIS — M316 Other giant cell arteritis: Secondary | ICD-10-CM | POA: Diagnosis not present

## 2021-12-26 DIAGNOSIS — L03116 Cellulitis of left lower limb: Secondary | ICD-10-CM | POA: Diagnosis not present

## 2021-12-26 DIAGNOSIS — I679 Cerebrovascular disease, unspecified: Secondary | ICD-10-CM | POA: Diagnosis not present

## 2021-12-26 DIAGNOSIS — M810 Age-related osteoporosis without current pathological fracture: Secondary | ICD-10-CM | POA: Diagnosis not present

## 2021-12-26 DIAGNOSIS — R634 Abnormal weight loss: Secondary | ICD-10-CM | POA: Diagnosis not present

## 2022-01-02 DIAGNOSIS — S81812A Laceration without foreign body, left lower leg, initial encounter: Secondary | ICD-10-CM | POA: Diagnosis not present

## 2022-01-02 DIAGNOSIS — L03116 Cellulitis of left lower limb: Secondary | ICD-10-CM | POA: Diagnosis not present

## 2022-02-20 DIAGNOSIS — Z7952 Long term (current) use of systemic steroids: Secondary | ICD-10-CM | POA: Diagnosis not present

## 2022-02-20 DIAGNOSIS — M316 Other giant cell arteritis: Secondary | ICD-10-CM | POA: Diagnosis not present

## 2022-02-20 DIAGNOSIS — Z681 Body mass index (BMI) 19 or less, adult: Secondary | ICD-10-CM | POA: Diagnosis not present

## 2022-02-20 DIAGNOSIS — M810 Age-related osteoporosis without current pathological fracture: Secondary | ICD-10-CM | POA: Diagnosis not present

## 2022-03-21 ENCOUNTER — Encounter: Payer: Self-pay | Admitting: Gastroenterology

## 2022-05-02 ENCOUNTER — Ambulatory Visit (AMBULATORY_SURGERY_CENTER): Payer: Self-pay | Admitting: *Deleted

## 2022-05-02 VITALS — Ht 64.0 in | Wt 98.0 lb

## 2022-05-02 DIAGNOSIS — Z86010 Personal history of colonic polyps: Secondary | ICD-10-CM

## 2022-05-02 MED ORDER — ONDANSETRON HCL 4 MG PO TABS
4.0000 mg | ORAL_TABLET | ORAL | 0 refills | Status: DC
Start: 2022-05-02 — End: 2022-11-22

## 2022-05-02 MED ORDER — NA SULFATE-K SULFATE-MG SULF 17.5-3.13-1.6 GM/177ML PO SOLN: 1.0000 | Freq: Once | ORAL | 0 refills | Status: AC

## 2022-05-02 NOTE — Progress Notes (Signed)
No egg or soy allergy known to patient  No issues known to pt with past sedation with any surgeries or procedures Patient denies ever being told they had issues or difficulty with intubation  No FH of Malignant Hyperthermia Pt is not on diet pills Pt is not on  home 02  Pt is not on blood thinners  Pt denies issues with constipation  No A fib or A flutter  NO PA's for preps discussed with pt In PV today  Discussed with pt there will be an out-of-pocket cost for prep and that varies from $0 to 70 +  dollars - pt verbalized understanding  Pt instructed to use Singlecare.com or GoodRx for a price reduction on prep - gOODrX COUPON TO PT IN pv TODAY FOR HER suPREP AT [facility] - PT STATES SUPER NAUSEATED WITH LAST PREP- DID GIVE HER ZOFRAN FOR NAUSEA PREVENTION WITH PREP THIS TIME

## 2022-05-23 DIAGNOSIS — M316 Other giant cell arteritis: Secondary | ICD-10-CM | POA: Diagnosis not present

## 2022-05-23 DIAGNOSIS — M810 Age-related osteoporosis without current pathological fracture: Secondary | ICD-10-CM | POA: Diagnosis not present

## 2022-05-23 DIAGNOSIS — Z681 Body mass index (BMI) 19 or less, adult: Secondary | ICD-10-CM | POA: Diagnosis not present

## 2022-05-23 DIAGNOSIS — Z7952 Long term (current) use of systemic steroids: Secondary | ICD-10-CM | POA: Diagnosis not present

## 2022-05-28 ENCOUNTER — Encounter: Payer: Self-pay | Admitting: Gastroenterology

## 2022-06-01 ENCOUNTER — Ambulatory Visit (AMBULATORY_SURGERY_CENTER): Payer: PPO | Admitting: Gastroenterology

## 2022-06-01 ENCOUNTER — Encounter: Payer: Self-pay | Admitting: Gastroenterology

## 2022-06-01 VITALS — BP 114/48 | HR 60 | Temp 97.8°F | Resp 8 | Ht 64.0 in | Wt 98.0 lb

## 2022-06-01 DIAGNOSIS — Z86010 Personal history of colonic polyps: Secondary | ICD-10-CM

## 2022-06-01 DIAGNOSIS — Z09 Encounter for follow-up examination after completed treatment for conditions other than malignant neoplasm: Secondary | ICD-10-CM

## 2022-06-01 MED ORDER — SODIUM CHLORIDE 0.9 % IV SOLN: 500.0000 mL | Freq: Once | INTRAVENOUS | Status: DC

## 2022-06-01 NOTE — Patient Instructions (Signed)
Resume previous diet and medications.  No repeat colonoscopy due to age and absence of polyps.   YOU HAD AN ENDOSCOPIC PROCEDURE TODAY AT [facility]:   Refer to the procedure report that was given to you for any specific questions about what was found during the examination.  If the procedure report does not answer your questions, please call your gastroenterologist to clarify.  If you requested that your care partner not be given the details of your procedure findings, then the procedure report has been included in a sealed envelope for you to review at your convenience later.  YOU SHOULD EXPECT: Some feelings of bloating in the abdomen. Passage of more gas than usual.  Walking can help get rid of the air that was put into your GI tract during the procedure and reduce the bloating. If you had a lower endoscopy (such as a colonoscopy or flexible sigmoidoscopy) you may notice spotting of blood in your stool or on the toilet paper. If you underwent a bowel prep for your procedure, you may not have a normal bowel movement for a few days.  Please Note:  You might notice some irritation and congestion in your nose or some drainage.  This is from the oxygen used during your procedure.  There is no need for concern and it should clear up in a day or so.  SYMPTOMS TO REPORT IMMEDIATELY:  Following lower endoscopy (colonoscopy or flexible sigmoidoscopy):  Excessive amounts of blood in the stool  Significant tenderness or worsening of abdominal pains  Swelling of the abdomen that is new, acute  Fever of 100F or higher  For urgent or emergent issues, a gastroenterologist can be reached at any hour by calling [phone]. Do not use MyChart messaging for urgent concerns.    DIET:  We do recommend a small meal at first, but then you may proceed to your regular diet.  Drink plenty of fluids but you should avoid alcoholic beverages for 24 hours.  ACTIVITY:  You should plan to take it  easy for the rest of today and you should NOT DRIVE or use heavy machinery until tomorrow (because of the sedation medicines used during the test).    FOLLOW UP: Our staff will call the number listed on your records the next business day following your procedure.  We will call around 7:15- 8:00 am to check on you and address any questions or concerns that you may have regarding the information given to you following your procedure. If we do not reach you, we will leave a message.  If you develop any symptoms (ie: fever, flu-like symptoms, shortness of breath, cough etc.) before then, please call [phone].  If you test positive for Covid 19 in the 2 weeks post procedure, please call and report this information to [place].    If any biopsies were taken you will be contacted by phone or by letter within the next 1-3 weeks.  Please call us at [phone] if you have not heard about the biopsies in 3 weeks.    SIGNATURES/CONFIDENTIALITY: You and/or your care partner have signed paperwork which will be entered into your electronic medical record.  These signatures attest to the fact that that the information above on your After Visit Summary has been reviewed and is understood.  Full responsibility of the confidentiality of this discharge information lies with you and/or your care-partner.

## 2022-06-01 NOTE — Progress Notes (Signed)
Report to PACU, RN, vss, BBS= Clear.  

## 2022-06-01 NOTE — Op Note (Signed)
[facility] Patient Name: Glennice Marcos Procedure Date: 06/01/2022 7:27 AM MRN: [id number] Endoscopist: Ladene Artist , MD Age: 76 Referring MD:  Date of Birth: 1946/08/22 Gender: Female Account #: [id number] Procedure:                Colonoscopy Indications:              Surveillance: Personal history of adenomatous                            polyps on last colonoscopy > 5 years ago Medicines:                Monitored Anesthesia Care Procedure:                Pre-Anesthesia Assessment:                           - Prior to the procedure, a History and Physical                            was performed, and patient medications and                            allergies were reviewed. The patient's tolerance of                            previous anesthesia was also reviewed. The risks                            and benefits of the procedure and the sedation                            options and risks were discussed with the patient.                            All questions were answered, and informed consent                            was obtained. Prior Anticoagulants: The patient has                            taken no previous anticoagulant or antiplatelet                            agents. ASA Grade Assessment: II - A patient with                            mild systemic disease. After reviewing the risks                            and benefits, the patient was deemed in                            satisfactory condition to undergo the procedure.  After obtaining informed consent, the colonoscope                            was passed under direct vision. Throughout the                            procedure, the patient's blood pressure, pulse, and                            oxygen saturations were monitored continuously. The                            PCF-HQ190L Colonoscope was introduced through the                            anus and advanced to  the the cecum, identified by                            appendiceal orifice and ileocecal valve. The                            ileocecal valve, appendiceal orifice, and rectum                            were photographed. The quality of the bowel                            preparation was excellent. The colonoscopy was                            performed without difficulty. The patient tolerated                            the procedure well. Scope In: 8:05:03 AM Scope Out: 8:17:06 AM Scope Withdrawal Time: 0 hours 7 minutes 41 seconds  Total Procedure Duration: 0 hours 12 minutes 3 seconds  Findings:                 The perianal and digital rectal examinations were                            normal.                           Internal hemorrhoids were found during                            retroflexion. The hemorrhoids were small and Grade                            I (internal hemorrhoids that do not prolapse).                           The exam was otherwise without abnormality on  direct and retroflexion views. Complications:            No immediate complications. Estimated blood loss:                            None. Estimated Blood Loss:     Estimated blood loss: none. Impression:               - Internal hemorrhoids.                           - The examination was otherwise normal on direct                            and retroflexion views.                           - No specimens collected. Recommendation:           - Patient has a contact number available for                            emergencies. The signs and symptoms of potential                            delayed complications were discussed with the                            patient. Return to normal activities tomorrow.                            Written discharge instructions were provided to the                            patient.                           - Resume previous diet.                            - Continue present medications.                           - No repeat colonoscopy due to age and the absence                            of colonic polyps. Ladene Artist, MD 06/01/2022 8:21:43 AM This report has been signed electronically.

## 2022-06-01 NOTE — Progress Notes (Signed)
Pt's states no medical or surgical changes since previsit or office visit. 

## 2022-06-01 NOTE — Progress Notes (Signed)
History & Physical  Primary Care Physician:  Donnajean Lopes, MD Primary Gastroenterologist: Lucio Edward, MD  CHIEF COMPLAINT: Personal history of colon polyps   HPI: Tiffany Nguyen is a 76 y.o. female with a personal history of adenomatous colon polyps for surveillance colonoscopy.   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 Authorizing Provider  aspirin 81 MG tablet Take 81 mg by mouth daily.   Yes [provider]  Calcium-Vitamin D-Vitamin K (VIACTIV PO) Take by mouth.   Yes [provider]  Cholecalciferol (VITAMIN D) 50 MCG (2000 UT) CAPS Take 2,000 Units by mouth daily.   Yes [provider]  cyanocobalamin 500 MCG tablet Take 500 mcg by mouth daily. Vitamin B12.   Yes [provider]  ondansetron (ZOFRAN) 4 MG tablet Take 1 tablet (4 mg total) by mouth as directed. 05/02/22  Yes Ladene Artist, MD  PEDIATRIC VITAMINS ACD-IRON PO Take 15 mg by mouth daily.   Yes [provider]  predniSONE (DELTASONE) 1 MG tablet Take 4 mg by mouth daily. 04/27/22  Yes [provider]   simvastatin (ZOCOR) 40 MG tablet Take 40 mg by mouth every evening.   Yes [provider]  alendronate (FOSAMAX) 70 MG tablet Take 70 mg by mouth once a week. Patient not taking: Reported on 05/02/2022 01/04/16   [provider]  calcium carbonate (OS-CAL - DOSED IN MG OF ELEMENTAL CALCIUM) 1250 (500 Ca) MG tablet Take 1 tablet by mouth daily with breakfast. Patient not taking: Reported on 05/02/2022    [provider]  triamcinolone (KENALOG) 0.025 % ointment Apply 1 application topically 2 (two) times daily. Do not use for more than 7 days. Patient not taking: Reported on 05/02/2022 06/05/19   Megan Salon, MD    Current Outpatient Medications  Medication Sig Dispense Refill   aspirin 81 MG tablet Take 81 mg by mouth daily.     Calcium-Vitamin D-Vitamin K (VIACTIV PO) Take by mouth.     Cholecalciferol (VITAMIN D) 50 MCG (2000 UT) CAPS Take 2,000 Units by mouth daily.     cyanocobalamin 500 MCG tablet Take 500 mcg by mouth daily. Vitamin B12.     ondansetron (ZOFRAN) 4 MG tablet Take 1 tablet (4 mg total) by mouth as directed. 2 tablet 0   PEDIATRIC VITAMINS ACD-IRON PO Take 15 mg by mouth daily.     predniSONE (DELTASONE) 1 MG tablet Take 4 mg by mouth daily.     simvastatin (ZOCOR) 40  MG tablet Take 40 mg by mouth every evening.     alendronate (FOSAMAX) 70 MG tablet Take 70 mg by mouth once a week. (Patient not taking: Reported on 05/02/2022)     calcium carbonate (OS-CAL - DOSED IN MG OF ELEMENTAL CALCIUM) 1250 (500 Ca) MG tablet Take 1 tablet by mouth daily with breakfast. (Patient not taking: Reported on 05/02/2022)     triamcinolone (KENALOG) 0.025 % ointment Apply 1 application topically 2 (two) times daily. Do not use for more than 7 days. (Patient not taking: Reported on 05/02/2022) 30 g 1   Current Facility-Administered Medications  Medication Dose Route Frequency Provider Last Rate Last Admin   0.9 %  sodium chloride infusion  500 mL Intravenous Once  Ladene Artist, MD        Allergies as of 06/01/2022 - Review Complete 06/01/2022  Allergen Reaction Noted   Hydrocodone-ibuprofen Nausea And Vomiting 01/30/2012   Sulfa antibiotics Rash 10/16/2021   Guaifenesin Nausea Only 05/31/2021    Family History  Problem Relation Age of Onset   Hypertension Mother    Pulmonary embolism Mother    COPD Father    COPD Brother        smoker   Stomach cancer Neg Hx    Colon cancer Neg Hx    Esophageal cancer Neg Hx    Pancreatic cancer Neg Hx    Colon polyps Neg Hx     Social History   Socioeconomic History   Marital status: Married    Spouse name: Not on file   Number of children: 1   Years of education: Not on file   Highest education level: Not on file  Occupational History    Employer: [employer]  Tobacco Use   Smoking status: Never   Smokeless tobacco: Never  Vaping Use   Vaping Use: Never used  Substance and Sexual Activity   Alcohol use: No   Drug use: No   Sexual activity: Not Currently    Partners: Male    Birth control/protection: Post-menopausal  Other Topics Concern   Not on file  Social History Narrative   Not on file   Social Determinants of Health   Financial Resource Strain: Not on file  Food Insecurity: Not on file  Transportation Needs: Not on file  Physical Activity: Not on file  Stress: Not on file  Social Connections: Not on file  Intimate Partner Violence: Not on file    Review of Systems:  All systems reviewed were negative except where noted in HPI.   Physical Exam: General:  Alert, well-developed, in NAD Head:  Normocephalic and atraumatic. Eyes:  Sclera clear, no icterus.   Conjunctiva pink. Ears:  Normal auditory acuity. Mouth:  No deformity or lesions.  Neck:  Supple; no masses . Lungs:  Clear throughout to auscultation.   No wheezes, crackles, or rhonchi. No acute distress. Heart:  Regular rate and rhythm; no murmurs. Abdomen:  Soft, nondistended, nontender. No  masses, hepatomegaly. No obvious masses.  Normal bowel .    Rectal:  Deferred   Msk:  Symmetrical without gross deformities.. Pulses:  Normal pulses noted. Extremities:  Without edema. Neurologic:  Alert and  oriented x4;  grossly normal neurologically. Skin:  Intact without significant lesions or rashes. Cervical Nodes:  No significant cervical adenopathy. Psych:  Alert and cooperative. Normal mood and affect.  Impression / Plan:   Personal history of adenomatous colon polyps for surveillance colonoscopy.  Pricilla Riffle. Fuller Plan  06/01/2022, 8:00  AM See Shea Evans, [facility], to contact our on call provider

## 2022-06-04 ENCOUNTER — Telehealth: Payer: Self-pay

## 2022-06-04 NOTE — Telephone Encounter (Signed)
No answer, left message to call if having any issues or concerns, B.Reshard Guillet RN 

## 2022-08-18 DIAGNOSIS — Z23 Encounter for immunization: Secondary | ICD-10-CM | POA: Diagnosis not present

## 2022-08-23 DIAGNOSIS — M316 Other giant cell arteritis: Secondary | ICD-10-CM | POA: Diagnosis not present

## 2022-08-23 DIAGNOSIS — Z7952 Long term (current) use of systemic steroids: Secondary | ICD-10-CM | POA: Diagnosis not present

## 2022-08-23 DIAGNOSIS — Z681 Body mass index (BMI) 19 or less, adult: Secondary | ICD-10-CM | POA: Diagnosis not present

## 2022-08-23 DIAGNOSIS — M810 Age-related osteoporosis without current pathological fracture: Secondary | ICD-10-CM | POA: Diagnosis not present

## 2022-08-23 DIAGNOSIS — R634 Abnormal weight loss: Secondary | ICD-10-CM | POA: Diagnosis not present

## 2022-08-23 DIAGNOSIS — R5383 Other fatigue: Secondary | ICD-10-CM | POA: Diagnosis not present

## 2022-09-13 DIAGNOSIS — M316 Other giant cell arteritis: Secondary | ICD-10-CM | POA: Diagnosis not present

## 2022-10-09 DIAGNOSIS — D649 Anemia, unspecified: Secondary | ICD-10-CM | POA: Diagnosis not present

## 2022-10-11 DIAGNOSIS — D649 Anemia, unspecified: Secondary | ICD-10-CM | POA: Diagnosis not present

## 2022-10-11 DIAGNOSIS — R7989 Other specified abnormal findings of blood chemistry: Secondary | ICD-10-CM | POA: Diagnosis not present

## 2022-10-11 DIAGNOSIS — M810 Age-related osteoporosis without current pathological fracture: Secondary | ICD-10-CM | POA: Diagnosis not present

## 2022-10-11 DIAGNOSIS — E785 Hyperlipidemia, unspecified: Secondary | ICD-10-CM | POA: Diagnosis not present

## 2022-10-16 DIAGNOSIS — M810 Age-related osteoporosis without current pathological fracture: Secondary | ICD-10-CM | POA: Diagnosis not present

## 2022-10-16 DIAGNOSIS — I679 Cerebrovascular disease, unspecified: Secondary | ICD-10-CM | POA: Diagnosis not present

## 2022-10-16 DIAGNOSIS — R82998 Other abnormal findings in urine: Secondary | ICD-10-CM | POA: Diagnosis not present

## 2022-10-16 DIAGNOSIS — D649 Anemia, unspecified: Secondary | ICD-10-CM | POA: Diagnosis not present

## 2022-10-16 DIAGNOSIS — M316 Other giant cell arteritis: Secondary | ICD-10-CM | POA: Diagnosis not present

## 2022-10-16 DIAGNOSIS — K224 Dyskinesia of esophagus: Secondary | ICD-10-CM | POA: Diagnosis not present

## 2022-10-16 DIAGNOSIS — E785 Hyperlipidemia, unspecified: Secondary | ICD-10-CM | POA: Diagnosis not present

## 2022-10-16 DIAGNOSIS — Z0000 Encounter for general adult medical examination without abnormal findings: Secondary | ICD-10-CM | POA: Diagnosis not present

## 2022-10-19 ENCOUNTER — Other Ambulatory Visit: Payer: Self-pay | Admitting: Internal Medicine

## 2022-10-19 DIAGNOSIS — I679 Cerebrovascular disease, unspecified: Secondary | ICD-10-CM

## 2022-10-22 DIAGNOSIS — Z681 Body mass index (BMI) 19 or less, adult: Secondary | ICD-10-CM | POA: Diagnosis not present

## 2022-10-22 DIAGNOSIS — Z7952 Long term (current) use of systemic steroids: Secondary | ICD-10-CM | POA: Diagnosis not present

## 2022-10-22 DIAGNOSIS — R5383 Other fatigue: Secondary | ICD-10-CM | POA: Diagnosis not present

## 2022-10-22 DIAGNOSIS — M316 Other giant cell arteritis: Secondary | ICD-10-CM | POA: Diagnosis not present

## 2022-11-02 ENCOUNTER — Other Ambulatory Visit: Payer: PPO

## 2022-11-07 ENCOUNTER — Other Ambulatory Visit: Payer: Self-pay | Admitting: *Deleted

## 2022-11-07 DIAGNOSIS — I6529 Occlusion and stenosis of unspecified carotid artery: Secondary | ICD-10-CM

## 2022-11-21 DIAGNOSIS — Z7952 Long term (current) use of systemic steroids: Secondary | ICD-10-CM | POA: Diagnosis not present

## 2022-11-21 DIAGNOSIS — M316 Other giant cell arteritis: Secondary | ICD-10-CM | POA: Diagnosis not present

## 2022-11-22 ENCOUNTER — Ambulatory Visit (HOSPITAL_COMMUNITY)
Admission: RE | Admit: 2022-11-22 | Discharge: 2022-11-22 | Disposition: A | Discharge status: Home / Self Care | Payer: PPO | Source: Ambulatory Visit | Attending: Vascular Surgery | Admitting: Vascular Surgery

## 2022-11-22 ENCOUNTER — Ambulatory Visit: Payer: PPO | Admitting: Vascular Surgery

## 2022-11-22 ENCOUNTER — Encounter: Payer: Self-pay | Admitting: Vascular Surgery

## 2022-11-22 VITALS — BP 112/67 | HR 69 | Temp 98.2°F | Resp 20 | Ht 64.0 in | Wt 94.0 lb

## 2022-11-22 DIAGNOSIS — I773 Arterial fibromuscular dysplasia: Secondary | ICD-10-CM

## 2022-11-22 DIAGNOSIS — I6529 Occlusion and stenosis of unspecified carotid artery: Secondary | ICD-10-CM | POA: Diagnosis not present

## 2022-11-22 NOTE — Progress Notes (Signed)
REASON FOR VISIT:   Follow-up of mild carotid disease.  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  Although today's study does suggest a 60 to 79% stenosis in the mid ICA the velocities of actually improved compared to this finding noted back on 08/03/2019.  In reviewing the images it looks like this is an area of tortuosity or intimal hyperplasia.  She is asymptomatic.  She is on aspirin and is on a statin.  Given that the study back in April was normal I think it would be reasonable to get a follow-up study in 9 months.  I ordered a follow-up carotid duplex scan in 9 months and I will see her at that time.  At that point we can likely stretch her follow-up out further.  I have explained to her that I will be retiring and she would like to be seen by Dr. Donzetta Matters after that.  He did her temporal artery biopsy.  Of note the biopsy was positive.  HPI:   Tiffany Nguyen is a pleasant 77 y.o. female who on a previous carotid duplex scan had shown significantly elevated velocities in the left internal carotid artery possibly consistent with intimal hyperplasia.  When I saw the patient in April 2022 the velocities had improved significantly.  We therefore stretched her follow-up out to 18 months.  She comes in for routine follow-up study.  Since I saw her last, she has had no history of stroke, TIAs, expressive or receptive aphasia, or amaurosis fugax.  She has had no other visual disturbances.  Of note, I saw her in July 2022 for possible temporal arteritis.  She underwent a temporal artery biopsy and the pathology did return positive for temporal arteritis.  She was on prednisone and was eventually weaned off of that but apparently has been restarted on it because of an elevated CRP and now they are weaning it again.  She is not a smoker.  She is on aspirin and is on a statin.  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   Temporal arteritis (HCC)    Tubular adenoma of colon 05/2014    Family History  Problem Relation Age of Onset   Hypertension Mother    Pulmonary embolism Mother    COPD Father    COPD Brother        smoker   Stomach cancer Neg Hx    Colon cancer Neg Hx    Esophageal cancer Neg Hx    Pancreatic cancer Neg Hx    Colon polyps Neg Hx     SOCIAL HISTORY: Social History   Tobacco Use   Smoking status: Never   Smokeless tobacco: Never  Substance Use Topics   Alcohol use: No    Allergies  Allergen Reactions   Hydrocodone-Ibuprofen Nausea And Vomiting   Sulfa Antibiotics Rash   Guaifenesin Nausea Only    Current Outpatient Medications  Medication Sig Dispense Refill   ascorbic acid (VITAMIN C) 500 MG tablet Take 500 mg  by mouth daily.     aspirin 81 MG tablet Take 81 mg by mouth daily.     Cholecalciferol (VITAMIN D) 50 MCG (2000 UT) CAPS Take 2,000 Units by mouth daily.     cyanocobalamin 500 MCG tablet Take 500 mcg by mouth daily. Vitamin B12.     magnesium 30 MG tablet Take 30 mg by mouth 2 (two) times daily.     PEDIATRIC VITAMINS ACD-IRON PO Take 15 mg by mouth daily.     predniSONE (DELTASONE) 1 MG tablet Take 15 mg by mouth daily. Patient has 5 mg and 1 mg tabs she titrate's as per instructions from Rheumatology     simvastatin (ZOCOR) 40 MG tablet Take 40 mg by mouth every evening.     vitamin k 100 MCG tablet Take 100 mcg by mouth daily.     No current facility-administered medications for this visit.    REVIEW OF SYSTEMS:  '[X]'$  denotes positive finding, '[ ]'$  denotes negative finding Cardiac  Comments:  Chest  pain or chest pressure:    Shortness of breath upon exertion:    Short of breath when lying flat:    Irregular heart rhythm:        Vascular    Pain in calf, thigh, or hip brought on by ambulation:    Pain in feet at night that wakes you up from your sleep:     Blood clot in your veins:    Leg swelling:         Pulmonary    Oxygen at home:    Productive cough:     Wheezing:         Neurologic    Sudden weakness in arms or legs:     Sudden numbness in arms or legs:     Sudden onset of difficulty speaking or slurred speech:    Temporary loss of vision in one eye:     Problems with dizziness:         Gastrointestinal    Blood in stool:     Vomited blood:         Genitourinary    Burning when urinating:     Blood in urine:        Psychiatric    Major depression:         Hematologic    Bleeding problems:    Problems with blood clotting too easily:        Skin    Rashes or ulcers:        Constitutional    Fever or chills:     PHYSICAL EXAM:   Vitals:   11/22/22 1012 11/22/22 1014  BP: 117/64 112/67  Pulse: 69   Resp: 20   Temp: 98.2 F (36.8 C)   SpO2: 98%   Weight: 94 lb (42.6 kg)   Height: '5\' 4"'$  (1.626 m)     GENERAL: The patient is a well-nourished female, in no acute distress. The vital signs are documented above. CARDIAC: There is a regular rate and rhythm.  VASCULAR: I do not detect carotid bruits. She has no significant lower extremity swelling. PULMONARY: There is good air exchange bilaterally without wheezing or rales. ABDOMEN: Soft and non-tender with normal pitched bowel sounds.  MUSCULOSKELETAL: There are no major deformities or cyanosis. NEUROLOGIC: No focal weakness or paresthesias are detected. SKIN: There are no ulcers or rashes noted. PSYCHIATRIC: The patient has a normal affect.  DATA:    CAROTID DUPLEX: I have independently interpreted  her carotid duplex scan today.  On the right side there is no carotid stenosis noted.  The right  vertebral artery is patent with antegrade flow.  On the left side there are elevated velocities in the midportion of the ICA suggesting a 60 to 79% stenosis.  In reviewing the images it looks to me like this is an area of tortuosity or potentially intimal hyperplasia.  Peak systolic velocity was 496 cm/s with an end-diastolic velocity of 80 cm/s.  Of note, on 08/03/2019 a similar finding was noted with a peak systolic velocity of 116 cm/s and an end-diastolic velocity of 92 cm/s.  Then on 02/15/2021 this area could not be identified.  Thus I really do not think the finding in the mid ICA on the left is new.  Deitra Mayo [facility] [phone]

## 2022-11-28 ENCOUNTER — Other Ambulatory Visit: Payer: Self-pay

## 2022-11-28 DIAGNOSIS — I6529 Occlusion and stenosis of unspecified carotid artery: Secondary | ICD-10-CM

## 2022-12-20 ENCOUNTER — Other Ambulatory Visit: Payer: PPO

## 2022-12-20 ENCOUNTER — Encounter: Payer: Self-pay | Admitting: Gastroenterology

## 2022-12-20 ENCOUNTER — Ambulatory Visit: Payer: PPO | Admitting: Gastroenterology

## 2022-12-20 VITALS — BP 94/60 | HR 68 | Ht 64.0 in | Wt 94.0 lb

## 2022-12-20 DIAGNOSIS — D509 Iron deficiency anemia, unspecified: Secondary | ICD-10-CM | POA: Diagnosis not present

## 2022-12-20 NOTE — Progress Notes (Addendum)
    Assessment     Iron deficiency anemia - R/O AVMs, ulcer, gastritis, celiac disease Mild intermittent periumbilical abdominal pain - R/O gastritis, ulcer Temporal arteritis on prednisone Internal hemorrhoids with minimal intermittent bleeding BMI=16.14   Recommendations    Continue NuIron tTG, IgA Schedule EGD. The risks (including bleeding, perforation, infection, missed lesions, medication reactions and possible hospitalization or surgery if complications occur), benefits, and alternatives to endoscopy with possible biopsy and possible dilation were discussed with the patient and they consent to proceed.   Consider VCE based on above results   HPI    This is a 77 year old female with an new iron deficiency anemia. Colonoscopy in July 2023 showed internal hemorrhoids, otherwise normal. From Nov 2023: Hgb=9.8, iron sat=14%, ferritin=48. She has temporal arteritis on prednisone 12.5 mg qd.  She relates rare episodes of scant amounts of bright red blood on the tissue paper when wiping felt secondary to hemorrhoids.  She relates weight loss felt secondary to temporal arteritis.  Prednisone was restarted and she feels she is now gaining weight. She relates an EGD performed many years ago however records are not available.  She notes occasional gnawing periumbilical pain. Denies constipation, diarrhea, change in stool caliber, melena, hematochezia, nausea, vomiting, dysphagia, reflux symptoms, chest pain.    Labs / Imaging       Latest Ref Rng & Units 08/07/2010   12:11 PM  Hepatic Function  Total Protein 6.0 - 8.3 g/dL 6.7   Albumin 3.5 - 5.2 g/dL 4.1   AST 0 - 37 U/L 19   ALT 0 - 35 U/L 12   Alk Phosphatase 39 - 117 U/L 58   Total Bilirubin 0.3 - 1.2 mg/dL 0.4        Latest Ref Rng & Units 06/06/2021    8:36 AM 06/02/2021   10:00 AM 08/07/2010   12:11 PM  CBC  WBC 4.0 - 10.5 K/uL  4.9  5.4   Hemoglobin 12.0 - 15.0 g/dL 12.2  9.8  11.8   Hematocrit 36.0 - 46.0 % 36.0  32.2   34.6   Platelets 150 - 400 K/uL  282  174     Current Medications, Allergies, Past Medical History, Past Surgical History, Family History and Social History were reviewed in [employer] record.   Physical Exam: General: Well developed, thin, no acute distress Head: Normocephalic and atraumatic Eyes: Sclerae anicteric, EOMI Ears: Normal auditory acuity Mouth: No deformities or lesions noted Lungs: Clear throughout to auscultation Heart: Regular rate and rhythm; No murmurs, rubs or bruits Abdomen: Soft, non tender and non distended. No masses, hepatosplenomegaly or hernias noted. Normal Bowel sounds Rectal: Not done Musculoskeletal: Symmetrical with no gross deformities  Pulses:  Normal pulses noted Extremities: No edema or deformities noted Neurological: Alert oriented x 4, grossly nonfocal Psychological:  Alert and cooperative. Normal mood and affect   Tiffany Nguyen T. Fuller Plan, MD 12/20/2022, 9:56 AM

## 2022-12-20 NOTE — Patient Instructions (Signed)
Your provider has requested that you go to the basement level for lab work before leaving today. Press "B" on the elevator. The lab is located at the first door on the left as you exit the elevator.  You have been scheduled for an endoscopy. Please follow written instructions given to you at your visit today. If you use inhalers (even only as needed), please bring them with you on the day of your procedure.  The [facility] providers would like to encourage you to use [facility] to communicate with providers for non-urgent requests or questions.  Due to long hold times on the telephone, sending your provider a message by [facility] may be a faster and more efficient way to get a response.  Please allow 48 business hours for a response.  Please remember that this is for non-urgent requests.   Due to recent changes in healthcare laws, you may see the results of your imaging and laboratory studies on MyChart before your provider has had a chance to review them.  We understand that in some cases there may be results that are confusing or concerning to you. Not all laboratory results come back in the same time frame and the provider may be waiting for multiple results in order to interpret others.  Please give [place] 48 hours in order for your provider to thoroughly review all the results before contacting the office for clarification of your results.   Thank you for choosing me and [facility].  Pricilla Riffle. Dagoberto Ligas., MD., Marval Regal

## 2022-12-21 LAB — TISSUE TRANSGLUTAMINASE, IGA: (tTG) Ab, IgA: <1 U/mL

## 2022-12-21 LAB — IGA: Immunoglobulin A: 73 mg/dL (ref 70–320)

## 2022-12-28 ENCOUNTER — Telehealth: Payer: Self-pay | Admitting: Gastroenterology

## 2022-12-28 NOTE — Telephone Encounter (Signed)
Informed patient that she can take her allergy medication and that it will not interfere with the procedure. Patient verbalized understanding.

## 2022-12-28 NOTE — Telephone Encounter (Signed)
Inbound call from patient, states she has a procedure on 2/20. Patient is requesting to speak to a nurse, questioning if it is safe to take Alegra or Zyrtec for allergies. Please advise.

## 2023-01-01 ENCOUNTER — Telehealth: Payer: Self-pay | Admitting: *Deleted

## 2023-01-01 ENCOUNTER — Encounter: Payer: Self-pay | Admitting: Gastroenterology

## 2023-01-01 ENCOUNTER — Ambulatory Visit (AMBULATORY_SURGERY_CENTER): Payer: PPO | Admitting: Gastroenterology

## 2023-01-01 VITALS — BP 130/54 | HR 62 | Temp 98.3°F | Resp 14 | Ht 64.0 in | Wt 94.0 lb

## 2023-01-01 DIAGNOSIS — D509 Iron deficiency anemia, unspecified: Secondary | ICD-10-CM | POA: Diagnosis not present

## 2023-01-01 DIAGNOSIS — K2940 Chronic atrophic gastritis without bleeding: Secondary | ICD-10-CM | POA: Diagnosis not present

## 2023-01-01 DIAGNOSIS — R1033 Periumbilical pain: Secondary | ICD-10-CM | POA: Diagnosis not present

## 2023-01-01 DIAGNOSIS — K2950 Unspecified chronic gastritis without bleeding: Secondary | ICD-10-CM | POA: Diagnosis not present

## 2023-01-01 MED ORDER — SODIUM CHLORIDE 0.9 % IV SOLN: 500.0000 mL | INTRAVENOUS | Status: DC

## 2023-01-01 NOTE — Op Note (Signed)
[facility] Patient Name: Tiffany Nguyen Procedure Date: 01/01/2023 10:01 AM MRN: [id number] Endoscopist: Ladene Artist , MD, [id number] Age: 77 Referring MD:  Date of Birth: May 13, 1946 Gender: Female Account #: [id number] Procedure:                Upper GI endoscopy Indications:              Periumbilical abdominal pain, Unexplained iron                            deficiency anemia Medicines:                Monitored Anesthesia Care Procedure:                Pre-Anesthesia Assessment:                           - Prior to the procedure, a History and Physical                            was performed, and patient medications and                            allergies were reviewed. The patient's tolerance of                            previous anesthesia was also reviewed. The risks                            and benefits of the procedure and the sedation                            options and risks were discussed with the patient.                            All questions were answered, and informed consent                            was obtained. Prior Anticoagulants: The patient has                            taken no anticoagulant or antiplatelet agents. ASA                            Grade Assessment: II - A patient with mild systemic                            disease. After reviewing the risks and benefits,                            the patient was deemed in satisfactory condition to                            undergo the procedure.  After obtaining informed consent, the endoscope was                            passed under direct vision. Throughout the                            procedure, the patient's blood pressure, pulse, and                            oxygen saturations were monitored continuously. The                            Olympus Scope [id number] was introduced through the                            mouth, and advanced to the second  part of duodenum.                            The upper GI endoscopy was accomplished without                            difficulty. The patient tolerated the procedure                            well. Scope In: Scope Out: Findings:                 The examined esophagus was normal.                           Diffuse atrophic mucosa was found in the entire                            examined stomach. Biopsies were taken with a cold                            forceps for histology.                           The exam of the stomach was otherwise normal.                           The duodenal bulb and second portion of the                            duodenum were normal. Biopsies for histology were                            taken with a cold forceps for evaluation of celiac                            disease. Complications:            No immediate complications. Estimated Blood Loss:     Estimated blood loss was minimal. Impression:               -  Normal esophagus.                           - Gastric mucosal atrophy. Biopsied.                           - Normal duodenal bulb and second portion of the                            duodenum. Biopsied. Recommendation:           - Patient has a contact number available for                            emergencies. The signs and symptoms of potential                            delayed complications were discussed with the                            patient. Return to normal activities tomorrow.                            Written discharge instructions were provided to the                            patient.                           - Resume previous diet.                           - Continue present medications.                           - Await pathology results.                           - Return to GI office in 1 month. Ladene Artist, MD 01/01/2023 10:31:11 AM This report has been signed electronically.

## 2023-01-01 NOTE — Patient Instructions (Addendum)
Please read handouts provided. Continue present medications. Await pathology results. Return to GI office in 1 month. Apply cool compresses to infiltrated right arm, 20 minutes on, 20 minutes off. Please phone emergency contact number if site becomes worse, if fever starts, if area looks infected.   YOU HAD AN ENDOSCOPIC PROCEDURE TODAY AT [facility]:   Refer to the procedure report that was given to you for any specific questions about what was found during the examination.  If the procedure report does not answer your questions, please call your gastroenterologist to clarify.  If you requested that your care partner not be given the details of your procedure findings, then the procedure report has been included in a sealed envelope for you to review at your convenience later.  YOU SHOULD EXPECT: Some feelings of bloating in the abdomen. Passage of more gas than usual.  Walking can help get rid of the air that was put into your GI tract during the procedure and reduce the bloating. If you had a lower endoscopy (such as a colonoscopy or flexible sigmoidoscopy) you may notice spotting of blood in your stool or on the toilet paper. If you underwent a bowel prep for your procedure, you may not have a normal bowel movement for a few days.  Please Note:  You might notice some irritation and congestion in your nose or some drainage.  This is from the oxygen used during your procedure.  There is no need for concern and it should clear up in a day or so.  SYMPTOMS TO REPORT IMMEDIATELY:  Following upper endoscopy (EGD)  Vomiting of blood or coffee ground material  New chest pain or pain under the shoulder blades  Painful or persistently difficult swallowing  New shortness of breath  Fever of 100F or higher  Black, tarry-looking stools  For urgent or emergent issues, a gastroenterologist can be reached at any hour by calling [phone]. Do not use MyChart messaging for urgent  concerns.    DIET:  We do recommend a small meal at first, but then you may proceed to your regular diet.  Drink plenty of fluids but you should avoid alcoholic beverages for 24 hours.  ACTIVITY:  You should plan to take it easy for the rest of today and you should NOT DRIVE or use heavy machinery until tomorrow (because of the sedation medicines used during the test).    FOLLOW UP: Our staff will call the number listed on your records the next business day following your procedure.  We will call around 7:15- 8:00 am to check on you and address any questions or concerns that you may have regarding the information given to you following your procedure. If we do not reach you, we will leave a message.     If any biopsies were taken you will be contacted by phone or by letter within the next 1-3 weeks.  Please call us at [phone] if you have not heard about the biopsies in 3 weeks.    SIGNATURES/CONFIDENTIALITY: You and/or your care partner have signed paperwork which will be entered into your electronic medical record.  These signatures attest to the fact that that the information above on your After Visit Summary has been reviewed and is understood.  Full responsibility of the confidentiality of this discharge information lies with you and/or your care-partner.YOU HAD AN ENDOSCOPIC PROCEDURE TODAY AT THE [facility]:   Refer to the procedure report that was given to you  for any specific questions about what was found during the examination.  If the procedure report does not answer your questions, please call your gastroenterologist to clarify.  If you requested that your care partner not be given the details of your procedure findings, then the procedure report has been included in a sealed envelope for you to review at your convenience later.  YOU SHOULD EXPECT: Some feelings of bloating in the abdomen. Passage of more gas than usual.  Walking can help get rid of the air that was  put into your GI tract during the procedure and reduce the bloating. If you had a lower endoscopy (such as a colonoscopy or flexible sigmoidoscopy) you may notice spotting of blood in your stool or on the toilet paper. If you underwent a bowel prep for your procedure, you may not have a normal bowel movement for a few days.  Please Note:  You might notice some irritation and congestion in your nose or some drainage.  This is from the oxygen used during your procedure.  There is no need for concern and it should clear up in a day or so.  SYMPTOMS TO REPORT IMMEDIATELY:    Following upper endoscopy (EGD)  Vomiting of blood or coffee ground material  New chest pain or pain under the shoulder blades  Painful or persistently difficult swallowing  New shortness of breath  Fever of 100F or higher  Black, tarry-looking stools  For urgent or emergent issues, a gastroenterologist can be reached at any hour by calling [phone]. Do not use MyChart messaging for urgent concerns.    DIET:  We do recommend a small meal at first, but then you may proceed to your regular diet.  Drink plenty of fluids but you should avoid alcoholic beverages for 24 hours.  ACTIVITY:  You should plan to take it easy for the rest of today and you should NOT DRIVE or use heavy machinery until tomorrow (because of the sedation medicines used during the test).    FOLLOW UP: Our staff will call the number listed on your records the next business day following your procedure.  We will call around 7:15- 8:00 am to check on you and address any questions or concerns that you may have regarding the information given to you following your procedure. If we do not reach you, we will leave a message.     If any biopsies were taken you will be contacted by phone or by letter within the next 1-3 weeks.  Please call us at [phone] if you have not heard about the biopsies in 3 weeks.    SIGNATURES/CONFIDENTIALITY: You and/or  your care partner have signed paperwork which will be entered into your electronic medical record.  These signatures attest to the fact that that the information above on your After Visit Summary has been reviewed and is understood.  Full responsibility of the confidentiality of this discharge information lies with you and/or your care-partner.

## 2023-01-01 NOTE — Progress Notes (Signed)
Pt's states no medical or surgical changes since previsit or office visit. 

## 2023-01-01 NOTE — Progress Notes (Signed)
Infiltrated PIV found on attempted Propofol induction. New PIVs, 22g attempted in procedure room by CRNA x2 attempts. No success. Charge RN called to room to assist and able to place new 24g PIV into left hand. Warm compresses placed on infiltrated PIV sites. Report to pacu rn. Vss. Care resumed by rn.

## 2023-01-01 NOTE — Progress Notes (Signed)
Infiltrated IV site assessed. Site was swollen, pink,IV insertion point was bruised. Entire area of swelling was 2 by 2 in. Instructed the patient to monitor the site. To report if it becomes red, hot or more swollen and tender. Encouraged her to use cool compresses rotating  on and off every  20 minutes. PT verbalized understanding.

## 2023-01-01 NOTE — Progress Notes (Signed)
See 12/20/2022 H&P, no changes

## 2023-01-01 NOTE — Telephone Encounter (Signed)
Called the patient back this afternoon to check on her Right PIV site that had infiltrated. The swelling has gone down and she took a Tylenol so she isn't having pain at this time. Encouraged her to continue to use the cold compress every 20 min on and off through the evening. Will check on her in the am as well.

## 2023-01-01 NOTE — Progress Notes (Signed)
Called to room to assist during endoscopic procedure.  Patient ID and intended procedure confirmed with present staff. Received instructions for my participation in the procedure from the performing physician.  

## 2023-01-02 ENCOUNTER — Telehealth: Payer: Self-pay

## 2023-01-02 NOTE — Telephone Encounter (Signed)
  Follow up Call-     01/01/2023    9:12 AM 06/01/2022    7:11 AM  Call back number  Post procedure Call Back phone  # [phone]  Permission to leave phone message Yes Yes     Patient questions:  Do you have a fever, pain , or abdominal swelling? No. Pain Score  0 *  Have you tolerated food without any problems? Yes.    Have you been able to return to your normal activities? Yes.    Do you have any questions about your discharge instructions: Diet   No. Medications  No. Follow up visit  No.  Do you have questions or concerns about your Care? No. Right arm infiltrated iv site decreased in swelling , patient applying ice PRN and took tylenol. No redness present. Patient agreed to call back if site become red ,swollen or if she develops fever.   Actions: * If pain score is 4 or above: No action needed, pain <4.

## 2023-01-10 ENCOUNTER — Telehealth: Payer: Self-pay | Admitting: Gastroenterology

## 2023-01-10 ENCOUNTER — Other Ambulatory Visit: Payer: Self-pay

## 2023-01-10 DIAGNOSIS — D509 Iron deficiency anemia, unspecified: Secondary | ICD-10-CM

## 2023-01-10 DIAGNOSIS — R634 Abnormal weight loss: Secondary | ICD-10-CM

## 2023-01-10 DIAGNOSIS — R1310 Dysphagia, unspecified: Secondary | ICD-10-CM

## 2023-01-10 NOTE — Telephone Encounter (Signed)
PT was told to come in for labs but she would like to know what are they for. Please advise.

## 2023-01-10 NOTE — Telephone Encounter (Signed)
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

## 2023-01-11 ENCOUNTER — Other Ambulatory Visit: Payer: Self-pay | Admitting: Obstetrics & Gynecology

## 2023-01-11 ENCOUNTER — Other Ambulatory Visit: Payer: Self-pay | Admitting: Physician Assistant

## 2023-01-11 DIAGNOSIS — Z1231 Encounter for screening mammogram for malignant neoplasm of breast: Secondary | ICD-10-CM

## 2023-01-14 ENCOUNTER — Other Ambulatory Visit: Payer: PPO

## 2023-01-14 DIAGNOSIS — R5383 Other fatigue: Secondary | ICD-10-CM | POA: Diagnosis not present

## 2023-01-14 DIAGNOSIS — R1310 Dysphagia, unspecified: Secondary | ICD-10-CM

## 2023-01-14 DIAGNOSIS — D509 Iron deficiency anemia, unspecified: Secondary | ICD-10-CM | POA: Diagnosis not present

## 2023-01-14 DIAGNOSIS — R634 Abnormal weight loss: Secondary | ICD-10-CM

## 2023-01-14 DIAGNOSIS — Z681 Body mass index (BMI) 19 or less, adult: Secondary | ICD-10-CM | POA: Diagnosis not present

## 2023-01-14 DIAGNOSIS — Z7952 Long term (current) use of systemic steroids: Secondary | ICD-10-CM | POA: Diagnosis not present

## 2023-01-14 DIAGNOSIS — D508 Other iron deficiency anemias: Secondary | ICD-10-CM | POA: Diagnosis not present

## 2023-01-14 DIAGNOSIS — K30 Functional dyspepsia: Secondary | ICD-10-CM | POA: Diagnosis not present

## 2023-01-14 DIAGNOSIS — M316 Other giant cell arteritis: Secondary | ICD-10-CM | POA: Diagnosis not present

## 2023-01-21 ENCOUNTER — Other Ambulatory Visit: Payer: Self-pay

## 2023-01-21 DIAGNOSIS — K2940 Chronic atrophic gastritis without bleeding: Secondary | ICD-10-CM

## 2023-01-22 ENCOUNTER — Other Ambulatory Visit (INDEPENDENT_AMBULATORY_CARE_PROVIDER_SITE_OTHER): Payer: PPO

## 2023-01-22 DIAGNOSIS — K2940 Chronic atrophic gastritis without bleeding: Secondary | ICD-10-CM | POA: Diagnosis not present

## 2023-01-22 LAB — VITAMIN B12: Vitamin B-12: 824 pg/mL (ref 211–911)

## 2023-01-23 LAB — REFLEX PARIETAL CELL AB TITER: PARIETAL CELL AB TITER: >1:640 {titer} — ABNORMAL HIGH

## 2023-01-23 LAB — ANTI-PARIETAL ANTIBODY: PARIETAL CELL AB SCREEN: POSITIVE — AB

## 2023-01-23 LAB — GASTRIN: Gastrin: 260 pg/mL — ABNORMAL HIGH (ref <=100)

## 2023-01-23 LAB — INTRINSIC FACTOR ANTIBODIES: Intrinsic Factor: POSITIVE — AB

## 2023-01-30 ENCOUNTER — Telehealth: Payer: Self-pay | Admitting: Gastroenterology

## 2023-01-30 ENCOUNTER — Encounter: Payer: Self-pay | Admitting: Gastroenterology

## 2023-01-30 ENCOUNTER — Other Ambulatory Visit (INDEPENDENT_AMBULATORY_CARE_PROVIDER_SITE_OTHER): Payer: PPO

## 2023-01-30 ENCOUNTER — Ambulatory Visit: Payer: PPO | Admitting: Gastroenterology

## 2023-01-30 VITALS — BP 110/66 | HR 70 | Ht 64.0 in | Wt 96.2 lb

## 2023-01-30 DIAGNOSIS — K2940 Chronic atrophic gastritis without bleeding: Secondary | ICD-10-CM

## 2023-01-30 DIAGNOSIS — D509 Iron deficiency anemia, unspecified: Secondary | ICD-10-CM

## 2023-01-30 LAB — CBC WITH DIFFERENTIAL/PLATELET
Basophils Absolute: 0 10*3/uL (ref 0.0–0.1)
Basophils Relative: 0.4 % (ref 0.0–3.0)
Eosinophils Absolute: 0.1 10*3/uL (ref 0.0–0.7)
Eosinophils Relative: 1 % (ref 0.0–5.0)
HCT: 35 % — ABNORMAL LOW (ref 36.0–46.0)
Hemoglobin: 11.4 g/dL — ABNORMAL LOW (ref 12.0–15.0)
Lymphocytes Relative: 18.4 % (ref 12.0–46.0)
Lymphs Abs: 1.8 10*3/uL (ref 0.7–4.0)
MCHC: 32.7 g/dL (ref 30.0–36.0)
MCV: 86.7 fl (ref 78.0–100.0)
Monocytes Absolute: 0.7 10*3/uL (ref 0.1–1.0)
Monocytes Relative: 7.2 % (ref 3.0–12.0)
Neutro Abs: 7 10*3/uL (ref 1.4–7.7)
Neutrophils Relative %: 73 % (ref 43.0–77.0)
Platelets: 252 10*3/uL (ref 150.0–400.0)
RBC: 4.04 Mil/uL (ref 3.87–5.11)
RDW: 17.2 % — ABNORMAL HIGH (ref 11.5–15.5)
WBC: 9.6 10*3/uL (ref 4.0–10.5)

## 2023-01-30 LAB — IBC + FERRITIN
Ferritin: 18.4 ng/mL (ref 10.0–291.0)
Iron: 59 ug/dL (ref 42–145)
Saturation Ratios: 19.8 % — ABNORMAL LOW (ref 20.0–50.0)
TIBC: 298.2 ug/dL (ref 250.0–450.0)
Transferrin: 213 mg/dL (ref 212.0–360.0)

## 2023-01-30 NOTE — Progress Notes (Signed)
    Assessment     Autoimmune metaplastic atrophic gastritis  IDA Temporal arteritis Internal hemorrhoids with intermittent bleeding BMI=16.51   Recommendations    Continue Nu-Iron 150 mg Monday Wednesday Friday CBC, Fe, TIBC, ferritin today Follow up with PCP for ongoing mgmt of IDA   HPI    This is a 77 year old female returning for follow-up of autoimmune metaplastic atrophic gastritis and iron deficiency anemia.  B12 level was normal.  She states her appetite and energy level have improved.  She has gained 2 pounds since her last office visit.  She remains on prednisone for temporal arteritis followed by her rheumatologist.   Labs / Imaging       Latest Ref Rng & Units 08/07/2010   12:11 PM  Hepatic Function  Total Protein 6.0 - 8.3 g/dL 6.7   Albumin 3.5 - 5.2 g/dL 4.1   AST 0 - 37 U/L 19   ALT 0 - 35 U/L 12   Alk Phosphatase 39 - 117 U/L 58   Total Bilirubin 0.3 - 1.2 mg/dL 0.4        Latest Ref Rng & Units 06/06/2021    8:36 AM 06/02/2021   10:00 AM 08/07/2010   12:11 PM  CBC  WBC 4.0 - 10.5 K/uL  4.9  5.4   Hemoglobin 12.0 - 15.0 g/dL 12.2  9.8  11.8   Hematocrit 36.0 - 46.0 % 36.0  32.2  34.6   Platelets 150 - 400 K/uL  282  174    Current Medications, Allergies, Past Medical History, Past Surgical History, Family History and Social History were reviewed in [employer] record.   Physical Exam: General: Well developed, thin, no acute distress Head: Normocephalic and atraumatic Eyes: Sclerae anicteric, EOMI Ears: Normal auditory acuity Mouth: No deformities or lesions noted Lungs: Clear throughout to auscultation Heart: Regular rate and rhythm; No murmurs, rubs or bruits Abdomen: Soft, non tender and non distended. No masses, hepatosplenomegaly or hernias noted. Normal Bowel sounds Rectal: Not done Musculoskeletal: Symmetrical with no gross deformities  Pulses:  Normal pulses noted Extremities: No edema or deformities  noted Neurological: Alert oriented x 4, grossly nonfocal Psychological:  Alert and cooperative. Normal mood and affect   Jany Buckwalter T. Fuller Plan, MD 01/30/2023, 9:46 AM

## 2023-01-30 NOTE — Telephone Encounter (Signed)
Inbound call from patient, returning Patty's call regarding lab results.

## 2023-01-30 NOTE — Telephone Encounter (Signed)
See results note. 

## 2023-01-30 NOTE — Patient Instructions (Signed)
Follow up with your Primary Care Physician   _______________________________________________________  If your blood pressure at your visit was 140/90 or greater, please contact your primary care physician to follow up on this.  _______________________________________________________  If you are age 77 or older, your body mass index should be between 23-30. Your Body mass index is 16.51 kg/m. If this is out of the aforementioned range listed, please consider follow up with your Primary Care Provider.  If you are age 37 or younger, your body mass index should be between 19-25. Your Body mass index is 16.51 kg/m. If this is out of the aformentioned range listed, please consider follow up with your Primary Care Provider.   ________________________________________________________  The [facility] providers would like to encourage you to use [facility] to communicate with providers for non-urgent requests or questions.  Due to long hold times on the telephone, sending your provider a message by [facility] may be a faster and more efficient way to get a response.  Please allow 48 business hours for a response.  Please remember that this is for non-urgent requests.  _______________________________________________________

## 2023-01-31 ENCOUNTER — Other Ambulatory Visit: Payer: Self-pay

## 2023-01-31 MED ORDER — POLYSACCHARIDE IRON COMPLEX 150 MG PO CAPS: 150.0000 mg | ORAL_CAPSULE | Freq: Every day | ORAL | 3 refills | Status: AC

## 2023-01-31 NOTE — Telephone Encounter (Signed)
See labs result note 

## 2023-01-31 NOTE — Telephone Encounter (Signed)
PT is returning call to discuss lab results.

## 2023-03-01 ENCOUNTER — Ambulatory Visit
Admission: RE | Admit: 2023-03-01 | Discharge: 2023-03-01 | Disposition: A | Discharge status: Home / Self Care | Payer: PPO | Source: Ambulatory Visit | Attending: Obstetrics & Gynecology | Admitting: Obstetrics & Gynecology

## 2023-03-01 DIAGNOSIS — Z1231 Encounter for screening mammogram for malignant neoplasm of breast: Secondary | ICD-10-CM

## 2023-03-25 DIAGNOSIS — Z681 Body mass index (BMI) 19 or less, adult: Secondary | ICD-10-CM | POA: Diagnosis not present

## 2023-03-25 DIAGNOSIS — R5383 Other fatigue: Secondary | ICD-10-CM | POA: Diagnosis not present

## 2023-03-25 DIAGNOSIS — Z7952 Long term (current) use of systemic steroids: Secondary | ICD-10-CM | POA: Diagnosis not present

## 2023-03-25 DIAGNOSIS — R634 Abnormal weight loss: Secondary | ICD-10-CM | POA: Diagnosis not present

## 2023-03-25 DIAGNOSIS — M316 Other giant cell arteritis: Secondary | ICD-10-CM | POA: Diagnosis not present

## 2023-04-18 DIAGNOSIS — M316 Other giant cell arteritis: Secondary | ICD-10-CM | POA: Diagnosis not present

## 2023-04-18 DIAGNOSIS — M810 Age-related osteoporosis without current pathological fracture: Secondary | ICD-10-CM | POA: Diagnosis not present

## 2023-04-18 DIAGNOSIS — R634 Abnormal weight loss: Secondary | ICD-10-CM | POA: Diagnosis not present

## 2023-04-18 DIAGNOSIS — D649 Anemia, unspecified: Secondary | ICD-10-CM | POA: Diagnosis not present

## 2023-04-18 DIAGNOSIS — K224 Dyskinesia of esophagus: Secondary | ICD-10-CM | POA: Diagnosis not present

## 2023-04-18 DIAGNOSIS — I679 Cerebrovascular disease, unspecified: Secondary | ICD-10-CM | POA: Diagnosis not present

## 2023-04-30 ENCOUNTER — Other Ambulatory Visit: Payer: Self-pay | Admitting: Internal Medicine

## 2023-04-30 DIAGNOSIS — M810 Age-related osteoporosis without current pathological fracture: Secondary | ICD-10-CM

## 2023-05-22 DIAGNOSIS — H2513 Age-related nuclear cataract, bilateral: Secondary | ICD-10-CM | POA: Diagnosis not present

## 2023-06-25 DIAGNOSIS — R634 Abnormal weight loss: Secondary | ICD-10-CM | POA: Diagnosis not present

## 2023-06-25 DIAGNOSIS — M810 Age-related osteoporosis without current pathological fracture: Secondary | ICD-10-CM | POA: Diagnosis not present

## 2023-06-25 DIAGNOSIS — M316 Other giant cell arteritis: Secondary | ICD-10-CM | POA: Diagnosis not present

## 2023-06-25 DIAGNOSIS — D508 Other iron deficiency anemias: Secondary | ICD-10-CM | POA: Diagnosis not present

## 2023-06-25 DIAGNOSIS — Z681 Body mass index (BMI) 19 or less, adult: Secondary | ICD-10-CM | POA: Diagnosis not present

## 2023-06-25 DIAGNOSIS — R5383 Other fatigue: Secondary | ICD-10-CM | POA: Diagnosis not present

## 2023-06-25 DIAGNOSIS — Z7952 Long term (current) use of systemic steroids: Secondary | ICD-10-CM | POA: Diagnosis not present

## 2023-07-30 DIAGNOSIS — H2512 Age-related nuclear cataract, left eye: Secondary | ICD-10-CM | POA: Diagnosis not present

## 2023-07-30 DIAGNOSIS — H25013 Cortical age-related cataract, bilateral: Secondary | ICD-10-CM | POA: Diagnosis not present

## 2023-07-30 DIAGNOSIS — H25043 Posterior subcapsular polar age-related cataract, bilateral: Secondary | ICD-10-CM | POA: Diagnosis not present

## 2023-07-30 DIAGNOSIS — H2513 Age-related nuclear cataract, bilateral: Secondary | ICD-10-CM | POA: Diagnosis not present

## 2023-07-30 DIAGNOSIS — H18413 Arcus senilis, bilateral: Secondary | ICD-10-CM | POA: Diagnosis not present

## 2023-08-01 ENCOUNTER — Ambulatory Visit (HOSPITAL_COMMUNITY)
Admission: RE | Admit: 2023-08-01 | Discharge: 2023-08-01 | Disposition: A | Discharge status: Home / Self Care | Payer: PPO | Source: Ambulatory Visit | Attending: Vascular Surgery | Admitting: Vascular Surgery

## 2023-08-01 ENCOUNTER — Encounter: Payer: Self-pay | Admitting: Vascular Surgery

## 2023-08-01 ENCOUNTER — Ambulatory Visit: Payer: PPO | Admitting: Vascular Surgery

## 2023-08-01 VITALS — BP 120/73 | HR 64 | Temp 98.2°F | Resp 20 | Ht 64.0 in | Wt 94.5 lb

## 2023-08-01 DIAGNOSIS — I6522 Occlusion and stenosis of left carotid artery: Secondary | ICD-10-CM

## 2023-08-01 DIAGNOSIS — I6529 Occlusion and stenosis of unspecified carotid artery: Secondary | ICD-10-CM | POA: Diagnosis not present

## 2023-08-01 NOTE — Progress Notes (Signed)
REASON FOR VISIT:   Follow-up of left carotid stenosis.  MEDICAL ISSUES:   LEFT CAROTID STENOSIS: This patient has a 60 to 79% left carotid stenosis likely secondary to intimal hyperplasia versus some tortuosity in the internal carotid artery.  The velocities have actually improved compared to the study 9 months ago.  She is asymptomatic.  Given that this is likely intimal hyperplasia and I do not think she has significant plaque I think we can safely stretch her follow-up out to 1 year.  I have ordered a follow-up duplex scan in 1 year.  She is agreeable to be seen on the PA schedule as she understands that I will be retiring.  However Dr. Randie Heinz did her temporal artery biopsy previously and she feels strongly about seeing him if she does need any further surgery.  She is on aspirin and is on a statin.  She is not a smoker.  She will call if she develops any new neurologic symptoms.  HPI:   Tiffany Nguyen is a pleasant 77 y.o. female who I last saw on 11/22/2022.  She had elevated velocities in her left internal carotid artery with possible intimal hyperplasia.  We have been following this.  When I saw her last her duplex scan suggested a 60 to 79% stenosis in the mid left ICA but the velocities had improved compared to her study back in September 2020.  In reviewing the images it looks like this was an area of tortuosity or intimal hyperplasia.  She was asymptomatic.  She was on aspirin and was on a statin.  She comes in for a 16-month follow-up visit.  Since I saw her last, she denies any history of stroke, TIAs, expressive or receptive aphasia, or amaurosis fugax.  She is on aspirin and is on a statin.  She is not a smoker.  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   Temporal arteritis (HCC)    Tubular adenoma of colon 05/2014    Family History  Problem Relation Age of Onset   Hypertension Mother    Pulmonary embolism Mother    COPD Father    COPD Brother        smoker   Colon cancer Maternal Uncle    Stomach cancer Neg Hx    Esophageal cancer Neg Hx    Pancreatic cancer Neg Hx    Colon polyps Neg Hx    Rectal cancer Neg Hx     SOCIAL HISTORY: Social History   Tobacco Use   Smoking status: Never   Smokeless tobacco: Never  Substance Use Topics   Alcohol use: No    Allergies  Allergen Reactions   Hydrocodone-Ibuprofen Nausea And Vomiting   Sulfa Antibiotics Rash   Guaifenesin Nausea Only    Current Outpatient Medications  Medication Sig Dispense Refill   ascorbic acid (VITAMIN C) 500 MG tablet Take 500 mg by mouth daily.     aspirin 81 MG tablet Take 81 mg by mouth daily.     Cholecalciferol (VITAMIN D) 50 MCG (2000 UT) CAPS Take 2,000 Units by  mouth daily.     cyanocobalamin 500 MCG tablet Take 500 mcg by mouth daily. Vitamin B12.     docusate sodium (COLACE) 50 MG capsule Take 50 mg by mouth daily.     iron polysaccharides (NU-IRON) 150 MG capsule Take 1 capsule (150 mg total) by mouth daily. Take with food 30 capsule 3   magnesium 30 MG tablet Take 30 mg by mouth 2 (two) times daily. Pt 250 mg     predniSONE (DELTASONE) 1 MG tablet Take 4 mg by mouth daily. Pt is taking 4 mg     simvastatin (ZOCOR) 40 MG tablet Take 40 mg by mouth every evening.     ketorolac (ACULAR) 0.5 % ophthalmic solution Place 1 drop into the left eye 4 (four) times daily. (Patient not taking: Reported on 08/01/2023)     moxifloxacin (VIGAMOX) 0.5 % ophthalmic solution Place 1 drop into the left eye 4 (four) times daily. (Patient not taking: Reported on 08/01/2023)     prednisoLONE acetate (PRED FORTE) 1 % ophthalmic suspension Place 1 drop into  the left eye 4 (four) times daily. (Patient not taking: Reported on 08/01/2023)     vitamin k 100 MCG tablet Take 100 mcg by mouth daily. (Patient not taking: Reported on 08/01/2023)     No current facility-administered medications for this visit.    REVIEW OF SYSTEMS:  [X]  denotes positive finding, [ ]  denotes negative finding Cardiac  Comments:  Chest pain or chest pressure:    Shortness of breath upon exertion:    Short of breath when lying flat:    Irregular heart rhythm:        Vascular    Pain in calf, thigh, or hip brought on by ambulation:    Pain in feet at night that wakes you up from your sleep:     Blood clot in your veins:    Leg swelling:         Pulmonary    Oxygen at home:    Productive cough:     Wheezing:         Neurologic    Sudden weakness in arms or legs:     Sudden numbness in arms or legs:     Sudden onset of difficulty speaking or slurred speech:    Temporary loss of vision in one eye:     Problems with dizziness:         Gastrointestinal    Blood in stool:     Vomited blood:         Genitourinary    Burning when urinating:     Blood in urine:        Psychiatric    Major depression:         Hematologic    Bleeding problems:    Problems with blood clotting too easily:        Skin    Rashes or ulcers:        Constitutional    Fever or chills:     PHYSICAL EXAM:   Vitals:   08/01/23 0929 08/01/23 0931  BP: 124/71 120/73  Pulse: 64   Resp: 20   Temp: 98.2 F (36.8 C)   SpO2: 98%   Weight: 94 lb 8 oz (42.9 kg)   Height: 5\' 4"  (1.626 m)     GENERAL: The patient is a well-nourished female, in no acute distress. The vital signs are documented above. CARDIAC: There is a regular rate and rhythm.  VASCULAR:  She does have a left carotid bruit. She has palpable pedal pulses. PULMONARY: There is good air exchange bilaterally without wheezing or rales. ABDOMEN: Soft and non-tender with normal pitched bowel sounds.  MUSCULOSKELETAL:  There are no major deformities or cyanosis. NEUROLOGIC: No focal weakness or paresthesias are detected. SKIN: There are no ulcers or rashes noted. PSYCHIATRIC: The patient has a normal affect.  DATA:    CAROTID DUPLEX: I have independently interpreted her carotid duplex scan today.  On the right side she has a less than 39% stenosis.  The right vertebral artery is patent with antegrade flow.  On the left side she has a 60 to 79% stenosis in the mid range.  However this is likely due to interval hyperplasia or some vessel tortuosity.  Peak systolic velocity was 210 cm/s with an end-diastolic velocity of 67 cm/s.  These velocities have improved compared to her study 9 months ago.  The left vertebral artery was patent with antegrade flow.  Tiffany Nguyen [facility] [phone]

## 2023-08-02 ENCOUNTER — Other Ambulatory Visit: Payer: Self-pay

## 2023-08-02 DIAGNOSIS — I6529 Occlusion and stenosis of unspecified carotid artery: Secondary | ICD-10-CM

## 2023-08-20 NOTE — Progress Notes (Signed)
77 y.o. G44P1001 Married White or Caucasian female here for breast and pelvic exam.  I am also following her for PMP status.  Denies vaginal bleeding.  She retired in 2022.  Was working at [employer] and [employer].  She has been diagnosed with temporal arteritis.  Is on deltasone 4mg .  She is hopeful to wean down from this some more this year.  Does need BMD updated.  Order placed but there is a duplicate from Dr. Jarold Motto.  So removed my order today.  H/o Vit D deficiency.  On supplemental Vit D.  Had constipation with calcium.    Patient's last menstrual period was 11/12/1996 (approximate).          Sexually active: No.  H/O STD:  no  Health Maintenance: PCP:  Dr. Jarold Motto Vaccines reviewed today. Colonoscopy:  06/01/2022, no follow up recommended MMG:  03/01/2023 Negative BMD:  08/24/2020  Last pap smear:  06/05/2019 Negative   reports that she has never smoked. She has never used smokeless tobacco. She reports that she does not drink alcohol and does not use drugs.  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     aspirin 81 MG tablet Take 81 mg by mouth daily.     Cholecalciferol (VITAMIN D) 50 MCG (2000 UT) CAPS Take 2,000 Units by mouth daily.     cyanocobalamin 500 MCG tablet Take 500 mcg by mouth daily. Vitamin B12.     docusate sodium (COLACE) 50 MG capsule Take 50 mg by mouth daily.     iron polysaccharides (NU-IRON) 150 MG capsule Take 1 capsule (150 mg total) by mouth daily. Take with food 30 capsule 3   magnesium 30 MG tablet Take 30 mg by mouth 2 (two) times daily. Pt 250 mg     predniSONE (DELTASONE) 1 MG tablet Take 4 mg by mouth daily. Pt is taking 4 mg     simvastatin (ZOCOR) 40 MG tablet Take 40 mg by mouth every evening.     No current facility-administered medications for this visit.    Family History  Problem Relation Age of Onset   Hypertension Mother    Pulmonary embolism Mother    COPD Father    COPD Brother        smoker   Colon cancer Maternal Uncle    Stomach cancer Neg Hx    Esophageal cancer Neg Hx    Pancreatic cancer Neg Hx  Colon polyps Neg Hx    Rectal cancer Neg Hx     Review of Systems  Constitutional: Negative.   Genitourinary: Negative.     Exam:   BP 110/63 (BP Location: Left Arm, Patient Position: Sitting, Cuff Size: Normal)   Pulse 67   Ht 5' 3.25" (1.607 m)   Wt 93 lb 12.8 oz (42.5 kg)   LMP 11/12/1996 (Approximate)   BMI 16.48 kg/m   Height: 5' 3.25" (160.7 cm)  General appearance: alert, cooperative and appears stated age Breasts: normal appearance, no masses or tenderness Abdomen: soft, non-tender; bowel sounds normal; no masses,  no organomegaly Lymph nodes: Cervical, supraclavicular, and axillary nodes normal.  No abnormal inguinal nodes palpated Neurologic: Grossly normal  Pelvic: External genitalia:  no lesions              Urethra:  normal appearing urethra with no masses, tenderness or lesions              Bartholins and Skenes: normal                 Vagina: normal  appearing vagina with atrophic changes and no discharge, no lesions              Cervix: no lesions              Pap taken: No. Bimanual Exam:  Uterus:  normal size, contour, position, consistency, mobility, non-tender              Adnexa: normal adnexa and no mass, fullness, tenderness               Rectovaginal: Confirms               Anus:  normal sphincter tone, no lesions  Chaperone, Sherri Rad, CMA, was present for exam.  Assessment/Plan: 1. Encntr for gyn exam (general) (routine) w/o abn findings - Pap smear not indicated per guidelines.  Discussed with pt today.  Comfortable with plan. - Mammogram 03/01/2023 - Colonoscopy 06/01/2022 - Bone mineral density 08/24/2020 - lab work done with PCP, Dr. Eloise Harman - vaccines reviewed/updated  2. Age-related osteoporosis without current pathological fracture - Has been ordered by Dr. Eloise Harman for [facility] location.  Pt aware if needs new order placed, to let me know or if location needs to be placed.  She will call for scheduling. - taking supplemental Vit D 2000 international units daily    3. Postmenopausal atrophic vaginitis  4. Vitamin D deficiency

## 2023-08-22 ENCOUNTER — Encounter (HOSPITAL_BASED_OUTPATIENT_CLINIC_OR_DEPARTMENT_OTHER): Payer: Self-pay | Admitting: Obstetrics & Gynecology

## 2023-08-22 ENCOUNTER — Ambulatory Visit (HOSPITAL_BASED_OUTPATIENT_CLINIC_OR_DEPARTMENT_OTHER): Payer: PPO | Admitting: Obstetrics & Gynecology

## 2023-08-22 VITALS — BP 110/63 | HR 67 | Ht 63.25 in | Wt 93.8 lb

## 2023-08-22 DIAGNOSIS — M810 Age-related osteoporosis without current pathological fracture: Secondary | ICD-10-CM | POA: Diagnosis not present

## 2023-08-22 DIAGNOSIS — E559 Vitamin D deficiency, unspecified: Secondary | ICD-10-CM

## 2023-08-22 DIAGNOSIS — Z01419 Encounter for gynecological examination (general) (routine) without abnormal findings: Secondary | ICD-10-CM | POA: Diagnosis not present

## 2023-08-22 DIAGNOSIS — N952 Postmenopausal atrophic vaginitis: Secondary | ICD-10-CM

## 2023-08-22 NOTE — Patient Instructions (Signed)
Call [phone] to scheduled at the [facility], [address], [address].  [place], [place] [address]   

## 2023-08-24 DIAGNOSIS — Z23 Encounter for immunization: Secondary | ICD-10-CM | POA: Diagnosis not present

## 2023-08-26 ENCOUNTER — Telehealth (HOSPITAL_BASED_OUTPATIENT_CLINIC_OR_DEPARTMENT_OTHER): Payer: Self-pay | Admitting: *Deleted

## 2023-08-26 DIAGNOSIS — E2839 Other primary ovarian failure: Secondary | ICD-10-CM

## 2023-08-26 DIAGNOSIS — M810 Age-related osteoporosis without current pathological fracture: Secondary | ICD-10-CM

## 2023-08-26 NOTE — Telephone Encounter (Signed)
Pt called today and requested that order for bone density be placed to be done at [facility]. Her PCP placed an order to be done at [facility] and they are booked out into next year. Order placed. Advised that she should let her PCP know that a new order has been placed. Pt verbalized understanding.

## 2023-08-30 ENCOUNTER — Ambulatory Visit (HOSPITAL_BASED_OUTPATIENT_CLINIC_OR_DEPARTMENT_OTHER)
Admission: RE | Admit: 2023-08-30 | Discharge: 2023-08-30 | Disposition: A | Discharge status: Home / Self Care | Payer: PPO | Source: Ambulatory Visit | Attending: Obstetrics & Gynecology | Admitting: Obstetrics & Gynecology

## 2023-08-30 DIAGNOSIS — M810 Age-related osteoporosis without current pathological fracture: Secondary | ICD-10-CM | POA: Insufficient documentation

## 2023-08-30 DIAGNOSIS — Z780 Asymptomatic menopausal state: Secondary | ICD-10-CM | POA: Diagnosis not present

## 2023-09-09 DIAGNOSIS — L720 Epidermal cyst: Secondary | ICD-10-CM | POA: Diagnosis not present

## 2023-09-24 DIAGNOSIS — M316 Other giant cell arteritis: Secondary | ICD-10-CM | POA: Diagnosis not present

## 2023-09-24 DIAGNOSIS — Z7952 Long term (current) use of systemic steroids: Secondary | ICD-10-CM | POA: Diagnosis not present

## 2023-09-24 DIAGNOSIS — Z681 Body mass index (BMI) 19 or less, adult: Secondary | ICD-10-CM | POA: Diagnosis not present

## 2023-09-30 DIAGNOSIS — H2512 Age-related nuclear cataract, left eye: Secondary | ICD-10-CM | POA: Diagnosis not present

## 2023-10-01 DIAGNOSIS — H2511 Age-related nuclear cataract, right eye: Secondary | ICD-10-CM | POA: Diagnosis not present

## 2023-10-06 IMAGING — CT CT ANGIO CHEST
2 of 8 series · 10 of 36 positions shown · IV contrast (iopamidol)
Comparison: Chest CTA 08/08/2021.

CLINICAL DATA: Chest pain. Palpable lumps on neck and chest for 5
months.

EXAM:
CT ANGIOGRAPHY CHEST WITH CONTRAST
TECHNIQUE: Multidetector CT imaging of the chest was performed using the
standard protocol during bolus administration of intravenous
contrast. Multiplanar CT image reconstructions and MIPs were
obtained to evaluate the vascular anatomy.
CONTRAST:  100mL BO21H6-Y7D IOPAMIDOL (BO21H6-Y7D) INJECTION 76%

[Series 6: cta pulmonary 2.00 bv36 s3 · coronal · 0.55mm/px · 1 of 120 slices shown]
[im 60/120  mediastinal]
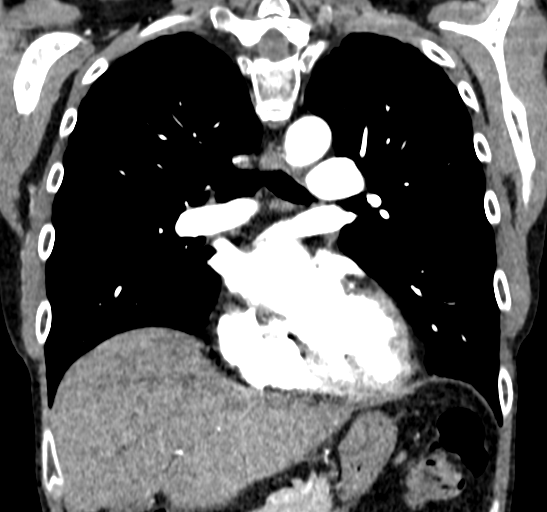

[Series 11: cta pulmonary 1.00 bv36 s3 super d. · axial · 0.53mm/px · z∈[+1574,+1803]mm · 9 of 359 slices shown]
[im 36/359  lung]
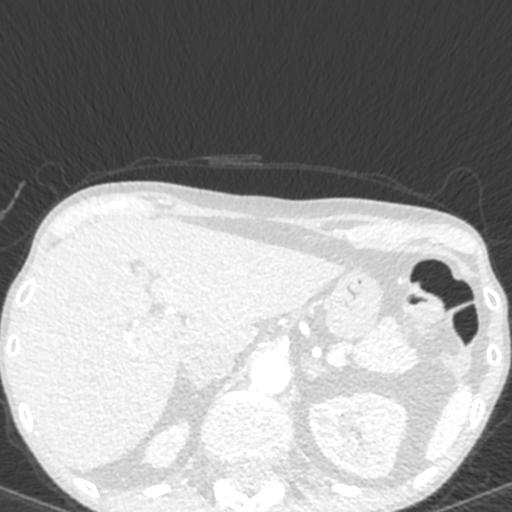
[im 72/359  mediastinal]
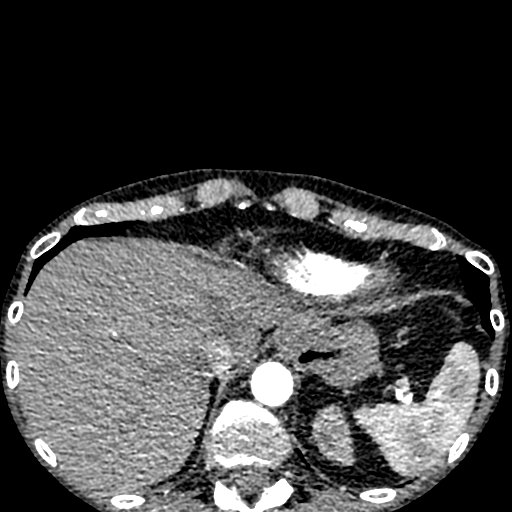
[im 108/359  lung]
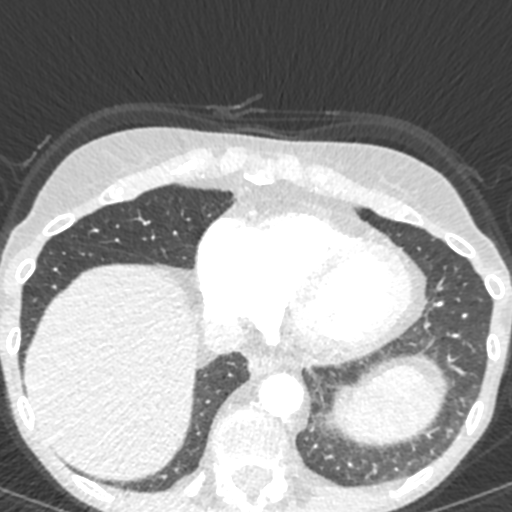
[im 144/359  mediastinal]
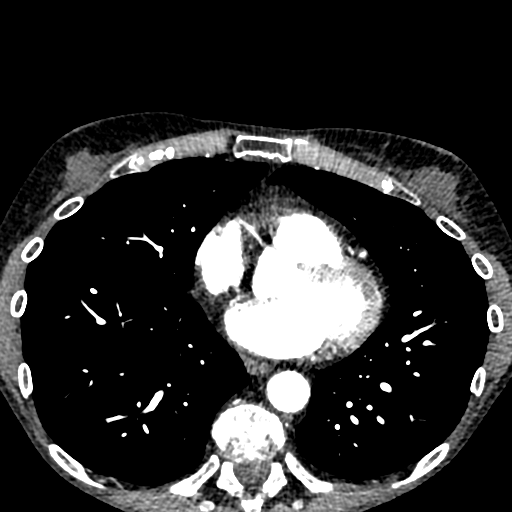
[im 180/359  lung]
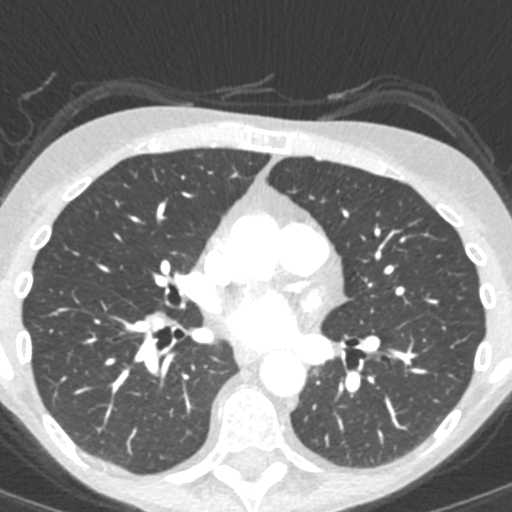
[im 215/359  mediastinal]
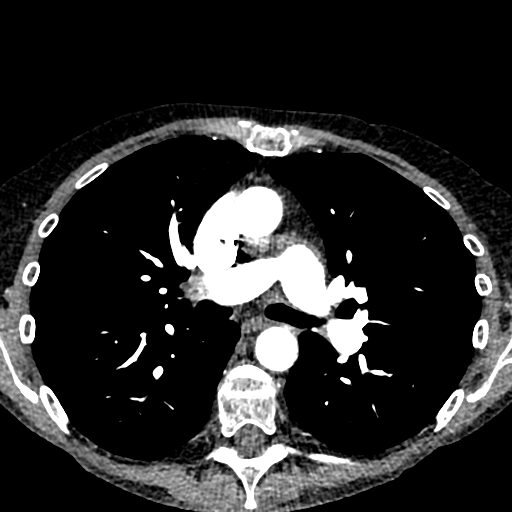
[im 251/359  lung]
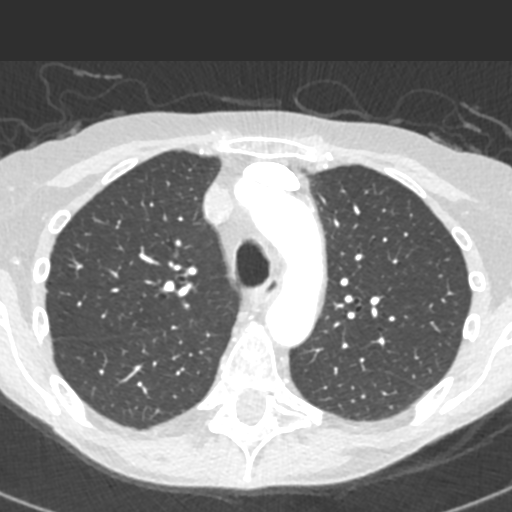
[im 287/359  mediastinal]
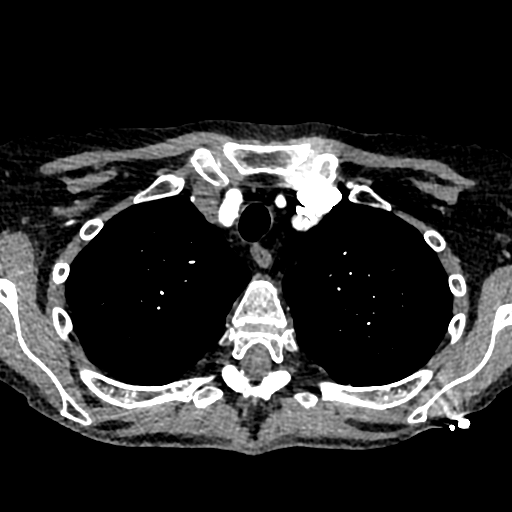
[im 323/359  lung]
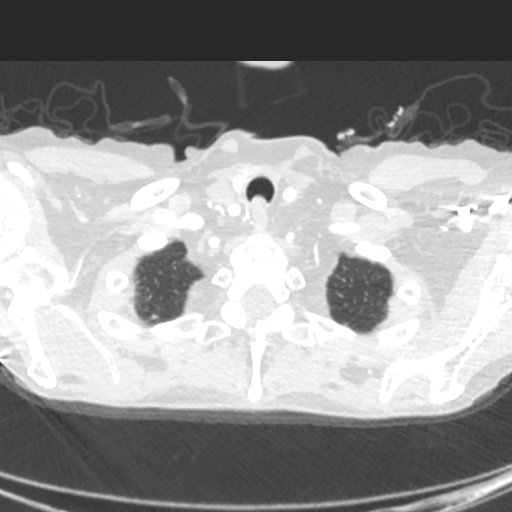

[10 of 36 positions shown; findings below may reference images not displayed]

FINDINGS: Cardiovascular: The pulmonary arteries are well opacified with
contrast to the level of the subsegmental branches. There is no
evidence of recurrent acute pulmonary embolism. Stable mild
atherosclerosis of the aorta, great vessels and coronary arteries.
No evidence of aneurysm or dissection. The heart size is normal.
There is no pericardial effusion.

Mediastinum/Nodes: There are no enlarged mediastinal, hilar or
axillary lymph nodes. The thyroid gland, trachea and esophagus
demonstrate no significant findings.

Lungs/Pleura: No pleural effusion or pneumothorax. Unchanged 4 mm
right upper lobe pulmonary nodule on image [DATE]. No new or
enlarging pulmonary nodules. Minimal biapical scarring.

Upper abdomen:  The visualized upper abdomen appears unremarkable.

Musculoskeletal/Chest wall: There is no chest wall mass or
suspicious osseous finding. Mild degenerative changes in the spine.
A capsule was placed over a palpable concern in the suprasternal
notch; no underlying abnormality identified.

Review of the MIP images confirms the above findings.
IMPRESSION: 1. No evidence of recurrent acute pulmonary embolism or other acute
vascular findings.
2. Mild aortic, coronary artery and great vessel atherosclerosis.
3. Unchanged 4 mm right upper lobe nodule compared with prior study
of 10 weeks ago. This appearance is almost certainly benign, and no
dedicated follow-up is required if this patient is low risk for
bronchogenic carcinoma (and has no known or suspected primary
neoplasm). Non-contrast chest CT can be considered in 12 months if
patient is high-risk. This recommendation follows the consensus
statement: Guidelines for Management of Incidental Pulmonary Nodules
Detected on CT Images: From the [HOSPITAL] 5533; Radiology
4. No chest wall abnormality identified. Clinical follow up of any
palpable concern recommended.

## 2023-10-21 DIAGNOSIS — R7982 Elevated C-reactive protein (CRP): Secondary | ICD-10-CM | POA: Diagnosis not present

## 2023-11-28 DIAGNOSIS — D649 Anemia, unspecified: Secondary | ICD-10-CM | POA: Diagnosis not present

## 2023-11-28 DIAGNOSIS — M810 Age-related osteoporosis without current pathological fracture: Secondary | ICD-10-CM | POA: Diagnosis not present

## 2023-11-28 DIAGNOSIS — Z1212 Encounter for screening for malignant neoplasm of rectum: Secondary | ICD-10-CM | POA: Diagnosis not present

## 2023-11-28 DIAGNOSIS — E785 Hyperlipidemia, unspecified: Secondary | ICD-10-CM | POA: Diagnosis not present

## 2023-12-05 DIAGNOSIS — Z0000 Encounter for general adult medical examination without abnormal findings: Secondary | ICD-10-CM | POA: Diagnosis not present

## 2023-12-05 DIAGNOSIS — D649 Anemia, unspecified: Secondary | ICD-10-CM | POA: Diagnosis not present

## 2023-12-05 DIAGNOSIS — R051 Acute cough: Secondary | ICD-10-CM | POA: Diagnosis not present

## 2023-12-05 DIAGNOSIS — M316 Other giant cell arteritis: Secondary | ICD-10-CM | POA: Diagnosis not present

## 2023-12-05 DIAGNOSIS — Z1152 Encounter for screening for COVID-19: Secondary | ICD-10-CM | POA: Diagnosis not present

## 2023-12-05 DIAGNOSIS — R059 Cough, unspecified: Secondary | ICD-10-CM | POA: Diagnosis not present

## 2023-12-05 DIAGNOSIS — I679 Cerebrovascular disease, unspecified: Secondary | ICD-10-CM | POA: Diagnosis not present

## 2023-12-05 DIAGNOSIS — K224 Dyskinesia of esophagus: Secondary | ICD-10-CM | POA: Diagnosis not present

## 2023-12-05 DIAGNOSIS — J00 Acute nasopharyngitis [common cold]: Secondary | ICD-10-CM | POA: Diagnosis not present

## 2023-12-05 DIAGNOSIS — R5383 Other fatigue: Secondary | ICD-10-CM | POA: Diagnosis not present

## 2023-12-05 DIAGNOSIS — E785 Hyperlipidemia, unspecified: Secondary | ICD-10-CM | POA: Diagnosis not present

## 2023-12-05 DIAGNOSIS — M810 Age-related osteoporosis without current pathological fracture: Secondary | ICD-10-CM | POA: Diagnosis not present

## 2023-12-05 DIAGNOSIS — R82998 Other abnormal findings in urine: Secondary | ICD-10-CM | POA: Diagnosis not present

## 2023-12-05 DIAGNOSIS — U071 COVID-19: Secondary | ICD-10-CM | POA: Diagnosis not present

## 2023-12-05 DIAGNOSIS — R634 Abnormal weight loss: Secondary | ICD-10-CM | POA: Diagnosis not present

## 2023-12-11 ENCOUNTER — Other Ambulatory Visit: Payer: PPO

## 2023-12-17 DIAGNOSIS — M316 Other giant cell arteritis: Secondary | ICD-10-CM | POA: Diagnosis not present

## 2023-12-17 DIAGNOSIS — R634 Abnormal weight loss: Secondary | ICD-10-CM | POA: Diagnosis not present

## 2023-12-17 DIAGNOSIS — Z7952 Long term (current) use of systemic steroids: Secondary | ICD-10-CM | POA: Diagnosis not present

## 2023-12-17 DIAGNOSIS — Z681 Body mass index (BMI) 19 or less, adult: Secondary | ICD-10-CM | POA: Diagnosis not present

## 2023-12-17 DIAGNOSIS — R5383 Other fatigue: Secondary | ICD-10-CM | POA: Diagnosis not present

## 2023-12-23 DIAGNOSIS — H2511 Age-related nuclear cataract, right eye: Secondary | ICD-10-CM | POA: Diagnosis not present

## 2024-02-21 ENCOUNTER — Other Ambulatory Visit: Payer: Self-pay | Admitting: Obstetrics & Gynecology

## 2024-02-21 DIAGNOSIS — Z1231 Encounter for screening mammogram for malignant neoplasm of breast: Secondary | ICD-10-CM

## 2024-03-17 DIAGNOSIS — R634 Abnormal weight loss: Secondary | ICD-10-CM | POA: Diagnosis not present

## 2024-03-17 DIAGNOSIS — M316 Other giant cell arteritis: Secondary | ICD-10-CM | POA: Diagnosis not present

## 2024-03-17 DIAGNOSIS — Z681 Body mass index (BMI) 19 or less, adult: Secondary | ICD-10-CM | POA: Diagnosis not present

## 2024-03-17 DIAGNOSIS — Z7952 Long term (current) use of systemic steroids: Secondary | ICD-10-CM | POA: Diagnosis not present

## 2024-03-17 DIAGNOSIS — R5383 Other fatigue: Secondary | ICD-10-CM | POA: Diagnosis not present

## 2024-05-14 DIAGNOSIS — M545 Low back pain, unspecified: Secondary | ICD-10-CM | POA: Diagnosis not present

## 2024-05-27 DIAGNOSIS — M545 Low back pain, unspecified: Secondary | ICD-10-CM | POA: Diagnosis not present

## 2024-06-11 DIAGNOSIS — E785 Hyperlipidemia, unspecified: Secondary | ICD-10-CM | POA: Diagnosis not present

## 2024-06-11 DIAGNOSIS — D649 Anemia, unspecified: Secondary | ICD-10-CM | POA: Diagnosis not present

## 2024-06-11 DIAGNOSIS — S32050A Wedge compression fracture of fifth lumbar vertebra, initial encounter for closed fracture: Secondary | ICD-10-CM | POA: Diagnosis not present

## 2024-06-11 DIAGNOSIS — K224 Dyskinesia of esophagus: Secondary | ICD-10-CM | POA: Diagnosis not present

## 2024-06-11 DIAGNOSIS — D485 Neoplasm of uncertain behavior of skin: Secondary | ICD-10-CM | POA: Diagnosis not present

## 2024-06-11 DIAGNOSIS — I679 Cerebrovascular disease, unspecified: Secondary | ICD-10-CM | POA: Diagnosis not present

## 2024-06-11 DIAGNOSIS — L720 Epidermal cyst: Secondary | ICD-10-CM | POA: Diagnosis not present

## 2024-06-11 DIAGNOSIS — M316 Other giant cell arteritis: Secondary | ICD-10-CM | POA: Diagnosis not present

## 2024-06-11 DIAGNOSIS — R634 Abnormal weight loss: Secondary | ICD-10-CM | POA: Diagnosis not present

## 2024-06-18 DIAGNOSIS — M316 Other giant cell arteritis: Secondary | ICD-10-CM | POA: Diagnosis not present

## 2024-06-18 DIAGNOSIS — M810 Age-related osteoporosis without current pathological fracture: Secondary | ICD-10-CM | POA: Diagnosis not present

## 2024-06-18 DIAGNOSIS — D508 Other iron deficiency anemias: Secondary | ICD-10-CM | POA: Diagnosis not present

## 2024-06-18 DIAGNOSIS — S32000D Wedge compression fracture of unspecified lumbar vertebra, subsequent encounter for fracture with routine healing: Secondary | ICD-10-CM | POA: Diagnosis not present

## 2024-06-18 DIAGNOSIS — R5383 Other fatigue: Secondary | ICD-10-CM | POA: Diagnosis not present

## 2024-06-18 DIAGNOSIS — Z7952 Long term (current) use of systemic steroids: Secondary | ICD-10-CM | POA: Diagnosis not present

## 2024-06-18 DIAGNOSIS — R634 Abnormal weight loss: Secondary | ICD-10-CM | POA: Diagnosis not present

## 2024-06-18 DIAGNOSIS — Z681 Body mass index (BMI) 19 or less, adult: Secondary | ICD-10-CM | POA: Diagnosis not present

## 2024-06-25 ENCOUNTER — Other Ambulatory Visit: Payer: Self-pay

## 2024-06-25 DIAGNOSIS — I6529 Occlusion and stenosis of unspecified carotid artery: Secondary | ICD-10-CM

## 2024-06-29 DIAGNOSIS — S32010D Wedge compression fracture of first lumbar vertebra, subsequent encounter for fracture with routine healing: Secondary | ICD-10-CM | POA: Diagnosis not present

## 2024-07-02 ENCOUNTER — Other Ambulatory Visit: Payer: Self-pay | Admitting: Vascular Surgery

## 2024-07-02 DIAGNOSIS — I6529 Occlusion and stenosis of unspecified carotid artery: Secondary | ICD-10-CM

## 2024-07-02 DIAGNOSIS — I6522 Occlusion and stenosis of left carotid artery: Secondary | ICD-10-CM

## 2024-07-17 DIAGNOSIS — S32010D Wedge compression fracture of first lumbar vertebra, subsequent encounter for fracture with routine healing: Secondary | ICD-10-CM | POA: Diagnosis not present

## 2024-07-22 DIAGNOSIS — S32010D Wedge compression fracture of first lumbar vertebra, subsequent encounter for fracture with routine healing: Secondary | ICD-10-CM | POA: Diagnosis not present

## 2024-07-29 DIAGNOSIS — S32010D Wedge compression fracture of first lumbar vertebra, subsequent encounter for fracture with routine healing: Secondary | ICD-10-CM | POA: Diagnosis not present

## 2024-07-30 DIAGNOSIS — H26493 Other secondary cataract, bilateral: Secondary | ICD-10-CM | POA: Diagnosis not present

## 2024-07-30 DIAGNOSIS — Z961 Presence of intraocular lens: Secondary | ICD-10-CM | POA: Diagnosis not present

## 2024-08-05 ENCOUNTER — Ambulatory Visit (HOSPITAL_COMMUNITY)
Admission: RE | Admit: 2024-08-05 | Discharge: 2024-08-05 | Disposition: A | Discharge status: Home / Self Care | Source: Ambulatory Visit | Attending: Vascular Surgery | Admitting: Vascular Surgery

## 2024-08-05 ENCOUNTER — Encounter: Payer: Self-pay | Admitting: Vascular Surgery

## 2024-08-05 ENCOUNTER — Ambulatory Visit: Admitting: Vascular Surgery

## 2024-08-05 VITALS — BP 120/68 | HR 67 | Temp 97.7°F | Ht 63.0 in | Wt 88.0 lb

## 2024-08-05 DIAGNOSIS — I6529 Occlusion and stenosis of unspecified carotid artery: Secondary | ICD-10-CM | POA: Diagnosis not present

## 2024-08-05 DIAGNOSIS — S32010D Wedge compression fracture of first lumbar vertebra, subsequent encounter for fracture with routine healing: Secondary | ICD-10-CM | POA: Diagnosis not present

## 2024-08-05 DIAGNOSIS — M316 Other giant cell arteritis: Secondary | ICD-10-CM | POA: Insufficient documentation

## 2024-08-05 DIAGNOSIS — I6522 Occlusion and stenosis of left carotid artery: Secondary | ICD-10-CM | POA: Diagnosis not present

## 2024-08-05 NOTE — Progress Notes (Signed)
 Patient ID: Tiffany Nguyen, female   DOB: January 19, 1946, 78 y.o.   MRN: [id number]  Reason for Consult: Follow-up   Referred by Yolande Toribio MATSU, MD  Subjective:     HPI:  Tiffany Nguyen is a 78 y.o. female well-known to me from history of temporal artery biopsy.  She has been followed by Dr. 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  She does not have any stroke, TIA or amaurosis.  She continues on aspirin and a statin and is also on continued steroids for giant cell arteritis.  Her husband passed away in 2024-06-23 this has been a great stressor for her but overall she is progressing well.  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   Temporal arteritis (HCC)    Tubular adenoma of colon 2014/06/23   Family History  Problem Relation Age of Onset   Hypertension Mother    Pulmonary embolism Mother    COPD Father    COPD Brother        smoker   Colon cancer Maternal Uncle    Stomach cancer Neg Hx    Esophageal cancer Neg Hx    Pancreatic cancer Neg Hx    Colon polyps Neg Hx    Rectal cancer Neg Hx    Past Surgical History:  Procedure Laterality Date   APPENDECTOMY  01/08/2002   ARTERY BIOPSY Left 06/06/2021   Procedure: LEFT TEMPORAL ARTERY BIOPSY;  Surgeon: Sheree Penne Bruckner, MD;  Location: [facility] OR;  Service: Vascular;  Laterality: Left;   COLONOSCOPY     KNEE SURGERY Left 11/12/1992   PARATHYROIDECTOMY  06/13/2007   POLYPECTOMY     TUBAL LIGATION  11/12/1981    Short Social History:  Social History   Tobacco Use   Smoking status: Never   Smokeless tobacco: Never  Substance Use  Topics   Alcohol  use: No    Allergies  Allergen Reactions   Hydrocodone-Ibuprofen Nausea And Vomiting   Sulfa Antibiotics Rash   Guaifenesin Nausea Only    Current Outpatient Medications  Medication Sig Dispense Refill   ascorbic acid (VITAMIN C) 500 MG tablet Take 500 mg by mouth daily.     aspirin 81 MG tablet Take 81 mg by mouth daily.     Cholecalciferol (VITAMIN D ) 50 MCG (2000 UT) CAPS Take 2,000 Units by mouth daily.     cyanocobalamin  500 MCG tablet Take 500 mcg by mouth daily. Vitamin B12.     docusate sodium (COLACE) 50 MG capsule Take 50 mg by mouth daily.     iron  polysaccharides (NU-IRON ) 150 MG capsule Take 1 capsule (150 mg total) by mouth daily. Take with food 30 capsule 3   magnesium 30 MG tablet Take 30 mg by mouth 2 (two) times daily. Pt 250 mg     predniSONE (DELTASONE) 1 MG tablet Take 4 mg by mouth daily. Pt is taking 4 mg     simvastatin (ZOCOR) 40 MG tablet Take 40 mg by mouth every evening.     No current facility-administered medications  for this visit.    Review of Systems  Constitutional:  Constitutional negative.      Underweight  HENT: HENT negative.  Eyes: Eyes negative.  Respiratory: Respiratory negative.  Cardiovascular: Cardiovascular negative.  GI: Gastrointestinal negative.  Musculoskeletal: Musculoskeletal negative.  Skin: Skin negative.  Hematologic: Positive for bruises/bleeds easily.        Objective:  Objective   Vitals:   08/05/24 0920 08/05/24 0922  BP: 115/60 120/68  Pulse: 67   Temp: 97.7 F (36.5 C)   SpO2: 98%   Weight: 88 lb (39.9 kg)   Height: 5' 3 (1.6 m)    Body mass index is 15.59 kg/m.  Physical Exam HENT:     Head: Normocephalic.     Nose: Nose normal.  Eyes:     Pupils: Pupils are equal, round, and reactive to light.  Neck:     Vascular: Carotid bruit present.  Pulmonary:     Effort: Pulmonary effort is normal.  Abdominal:     General: Abdomen is flat.  Musculoskeletal:        General: Normal  range of motion.     Right lower leg: No edema.     Left lower leg: No edema.  Skin:    General: Skin is warm.     Capillary Refill: Capillary refill takes less than 2 seconds.  Neurological:     General: No focal deficit present.     Mental Status: She is alert.  Psychiatric:        Mood and Affect: Mood normal.     Data: Right Carotid Findings:  +----------+--------+--------+--------+------------------+--------+           PSV cm/sEDV cm/sStenosisPlaque DescriptionComments  +----------+--------+--------+--------+------------------+--------+  CCA Prox  101     23              heterogenous                +----------+--------+--------+--------+------------------+--------+  CCA Distal81      19              heterogenous                +----------+--------+--------+--------+------------------+--------+  ICA Prox  101     28      1-39%   heterogenous                +----------+--------+--------+--------+------------------+--------+  ICA Mid   100     35                                          +----------+--------+--------+--------+------------------+--------+  ICA Distal83      28                                          +----------+--------+--------+--------+------------------+--------+  ECA      102     3                                           +----------+--------+--------+--------+------------------+--------+   +----------+--------+-------+--------+-------------------+           PSV cm/sEDV cmsDescribeArm Pressure (mmHG)  +----------+--------+-------+--------+-------------------+  Subclavian56     0                                   +----------+--------+-------+--------+-------------------+   +---------+--------+--+--------+--+---------+  VertebralPSV cm/s69EDV cm/s17Antegrade  +---------+--------+--+--------+--+---------+      Left Carotid Findings:   +----------+--------+--------+--------+------------------+--------+           PSV cm/sEDV cm/sStenosisPlaque DescriptionComments  +----------+--------+--------+--------+------------------+--------+  CCA Prox  81      17              heterogenous                +----------+--------+--------+--------+------------------+--------+  CCA Distal99      22              heterogenous                +----------+--------+--------+--------+------------------+--------+  ICA Prox  119     24      1-39%   heterogenous                +----------+--------+--------+--------+------------------+--------+  ICA Mid   247     77      60-79%                              +----------+--------+--------+--------+------------------+--------+  ICA Distal89      35                                          +----------+--------+--------+--------+------------------+--------+  ECA      118     11                                          +----------+--------+--------+--------+------------------+--------+   +----------+--------+--------+--------+-------------------+           PSV cm/sEDV cm/sDescribeArm Pressure (mmHG)  +----------+--------+--------+--------+-------------------+  [id number]    0                                    +----------+--------+--------+--------+-------------------+   +---------+--------+--+--------+--+---------+  VertebralPSV cm/s72EDV cm/s17Antegrade  +---------+--------+--+--------+--+---------+         Summary:  Right Carotid: Velocities in the right ICA are consistent with a 1-39%  stenosis.   Left Carotid: Velocities in the left ICA are consistent with a 60-79%  stenosis.   Vertebrals: Bilateral vertebral arteries demonstrate antegrade flow.  Subclavians: Normal flow hemodynamics were seen in bilateral subclavian               arteries.      Assessment/Plan:     78 year old female with history of moderate  grade carotid artery stenosis on the left which has previously been followed in our office by Dr. Eliza she also was diagnosed with giant cell arteritis in the past and continues on steroids.  We discussed the signs and symptoms of stroke and she demonstrated very understanding.  Plan will be for follow-up in 1 year with repeat carotid duplex.     Penne Lonni Colorado MD [facility]

## 2024-08-10 DIAGNOSIS — S32020D Wedge compression fracture of second lumbar vertebra, subsequent encounter for fracture with routine healing: Secondary | ICD-10-CM | POA: Diagnosis not present

## 2024-08-15 DIAGNOSIS — Z23 Encounter for immunization: Secondary | ICD-10-CM | POA: Diagnosis not present

## 2024-09-23 DIAGNOSIS — R634 Abnormal weight loss: Secondary | ICD-10-CM | POA: Diagnosis not present

## 2024-09-23 DIAGNOSIS — M316 Other giant cell arteritis: Secondary | ICD-10-CM | POA: Diagnosis not present

## 2024-09-23 DIAGNOSIS — Z7952 Long term (current) use of systemic steroids: Secondary | ICD-10-CM | POA: Diagnosis not present

## 2024-09-23 DIAGNOSIS — R5383 Other fatigue: Secondary | ICD-10-CM | POA: Diagnosis not present

## 2024-09-23 DIAGNOSIS — M810 Age-related osteoporosis without current pathological fracture: Secondary | ICD-10-CM | POA: Diagnosis not present

## 2024-09-23 DIAGNOSIS — Z681 Body mass index (BMI) 19 or less, adult: Secondary | ICD-10-CM | POA: Diagnosis not present

## 2024-09-24 DIAGNOSIS — H26493 Other secondary cataract, bilateral: Secondary | ICD-10-CM | POA: Diagnosis not present

## 2024-09-24 DIAGNOSIS — Z961 Presence of intraocular lens: Secondary | ICD-10-CM | POA: Diagnosis not present

## 2024-09-24 DIAGNOSIS — H18413 Arcus senilis, bilateral: Secondary | ICD-10-CM | POA: Diagnosis not present

## 2024-09-24 DIAGNOSIS — H26491 Other secondary cataract, right eye: Secondary | ICD-10-CM | POA: Diagnosis not present

## 2024-09-24 DIAGNOSIS — H02831 Dermatochalasis of right upper eyelid: Secondary | ICD-10-CM | POA: Diagnosis not present
